# Patient Record
Sex: Female | Born: 1950 | Race: White | Hispanic: No | Marital: Single | Smoking: Never smoker
Health system: Southern US, Community
[De-identification: ages and names within clinical notes are randomized; demographics above are authoritative.]

## PROBLEM LIST (undated history)

## (undated) DIAGNOSIS — H269 Unspecified cataract: Secondary | ICD-10-CM

## (undated) DIAGNOSIS — F419 Anxiety disorder, unspecified: Secondary | ICD-10-CM

## (undated) HISTORY — DX: Unspecified cataract: H26.9

## (undated) HISTORY — DX: Anxiety disorder, unspecified: F41.9

---

## 1997-12-12 ENCOUNTER — Other Ambulatory Visit: Admission: RE | Admit: 1997-12-12 | Discharge: 1997-12-12 | Payer: Self-pay | Admitting: Obstetrics and Gynecology

## 1999-03-16 ENCOUNTER — Encounter (INDEPENDENT_AMBULATORY_CARE_PROVIDER_SITE_OTHER): Payer: Self-pay | Admitting: Specialist

## 1999-03-16 ENCOUNTER — Ambulatory Visit (HOSPITAL_COMMUNITY): Admission: RE | Admit: 1999-03-16 | Discharge: 1999-03-16 | Payer: Self-pay | Admitting: Gastroenterology

## 1999-05-04 ENCOUNTER — Encounter: Admission: RE | Admit: 1999-05-04 | Discharge: 1999-05-04 | Payer: Self-pay | Admitting: Gastroenterology

## 1999-05-04 ENCOUNTER — Encounter: Payer: Self-pay | Admitting: Gastroenterology

## 1999-09-26 ENCOUNTER — Encounter: Payer: Self-pay | Admitting: Gastroenterology

## 1999-09-26 ENCOUNTER — Encounter: Admission: RE | Admit: 1999-09-26 | Discharge: 1999-09-26 | Payer: Self-pay | Admitting: Gastroenterology

## 1999-10-16 ENCOUNTER — Other Ambulatory Visit: Admission: RE | Admit: 1999-10-16 | Discharge: 1999-10-16 | Payer: Self-pay | Admitting: *Deleted

## 2002-06-24 ENCOUNTER — Other Ambulatory Visit: Admission: RE | Admit: 2002-06-24 | Discharge: 2002-06-24 | Payer: Self-pay | Admitting: Obstetrics and Gynecology

## 2002-06-30 ENCOUNTER — Encounter: Payer: Self-pay | Admitting: Obstetrics and Gynecology

## 2002-06-30 ENCOUNTER — Encounter: Admission: RE | Admit: 2002-06-30 | Discharge: 2002-06-30 | Payer: Self-pay | Admitting: Obstetrics and Gynecology

## 2003-08-05 ENCOUNTER — Other Ambulatory Visit: Admission: RE | Admit: 2003-08-05 | Discharge: 2003-08-05 | Payer: Self-pay | Admitting: Obstetrics and Gynecology

## 2003-08-18 ENCOUNTER — Encounter: Admission: RE | Admit: 2003-08-18 | Discharge: 2003-08-18 | Payer: Self-pay | Admitting: Family Medicine

## 2004-10-19 ENCOUNTER — Encounter: Admission: RE | Admit: 2004-10-19 | Discharge: 2004-10-19 | Payer: Self-pay | Admitting: Obstetrics and Gynecology

## 2005-02-06 ENCOUNTER — Encounter: Admission: RE | Admit: 2005-02-06 | Discharge: 2005-02-06 | Payer: Self-pay | Admitting: Obstetrics and Gynecology

## 2005-03-19 ENCOUNTER — Other Ambulatory Visit: Admission: RE | Admit: 2005-03-19 | Discharge: 2005-03-19 | Payer: Self-pay | Admitting: Obstetrics and Gynecology

## 2005-09-10 ENCOUNTER — Encounter: Admission: RE | Admit: 2005-09-10 | Discharge: 2005-09-10 | Payer: Self-pay | Admitting: Family Medicine

## 2007-06-04 ENCOUNTER — Encounter: Admission: RE | Admit: 2007-06-04 | Discharge: 2007-06-04 | Payer: Self-pay | Admitting: Obstetrics and Gynecology

## 2010-06-10 ENCOUNTER — Encounter: Payer: Self-pay | Admitting: Family Medicine

## 2010-10-12 ENCOUNTER — Other Ambulatory Visit: Payer: Self-pay | Admitting: Family Medicine

## 2010-10-12 DIAGNOSIS — Z1231 Encounter for screening mammogram for malignant neoplasm of breast: Secondary | ICD-10-CM

## 2010-10-19 ENCOUNTER — Ambulatory Visit
Admission: RE | Admit: 2010-10-19 | Discharge: 2010-10-19 | Disposition: A | Discharge status: Home / Self Care | Payer: Self-pay | Source: Ambulatory Visit | Attending: Family Medicine | Admitting: Family Medicine

## 2010-10-19 DIAGNOSIS — Z1231 Encounter for screening mammogram for malignant neoplasm of breast: Secondary | ICD-10-CM

## 2011-06-23 ENCOUNTER — Telehealth: Payer: Self-pay | Admitting: Physician Assistant

## 2011-06-23 MED ORDER — LORAZEPAM 1 MG PO TABS: 1.0000 mg | ORAL_TABLET | Freq: Every day | ORAL | Status: AC

## 2011-06-23 NOTE — Telephone Encounter (Signed)
Please call to pharmacy.

## 2011-06-24 NOTE — Telephone Encounter (Signed)
rx denied and called to pharmacy

## 2011-07-26 ENCOUNTER — Other Ambulatory Visit: Payer: Self-pay | Admitting: *Deleted

## 2011-08-22 ENCOUNTER — Ambulatory Visit (INDEPENDENT_AMBULATORY_CARE_PROVIDER_SITE_OTHER): Payer: BC Managed Care – PPO | Admitting: Family Medicine

## 2011-08-22 ENCOUNTER — Encounter: Payer: Self-pay | Admitting: Family Medicine

## 2011-08-22 VITALS — BP 82/53 | HR 72 | Temp 97.9°F | Resp 16 | Ht 65.5 in | Wt 114.6 lb

## 2011-08-22 DIAGNOSIS — F419 Anxiety disorder, unspecified: Secondary | ICD-10-CM

## 2011-08-22 DIAGNOSIS — F411 Generalized anxiety disorder: Secondary | ICD-10-CM

## 2011-08-22 MED ORDER — CITALOPRAM HYDROBROMIDE 20 MG PO TABS: 20.0000 mg | ORAL_TABLET | Freq: Every day | ORAL | Status: DC

## 2011-08-22 MED ORDER — LORAZEPAM 1 MG PO TABS: 1.0000 mg | ORAL_TABLET | Freq: Every evening | ORAL | Status: DC | PRN

## 2011-08-22 NOTE — Progress Notes (Signed)
Recently saw [facility] Doing well on meds Will see Dr. Evelene Croon next month.  Objective: thin, attractive woman in no distress. We chatted for awhile. Patient wants to eventually get off meds, but when she tries to d/c, her symptoms come back. Hence, the appointment with Dr. Evelene Croon. HEENT unremarkable  Chest: Clear  Heart: Regular no murmur or gallop  Abdomen: Soft nontender without HSM  A: no change, patient doing well on nontoxic meds OK to refill meds x 1 year.

## 2012-01-26 ENCOUNTER — Other Ambulatory Visit: Payer: Self-pay | Admitting: Family Medicine

## 2012-03-31 ENCOUNTER — Telehealth: Payer: Self-pay | Admitting: Radiology

## 2012-03-31 NOTE — Telephone Encounter (Signed)
Phone call to patient, she needs bone density study per her insurance company. Have left message for her so she can discuss with Dr Milus Glazier at next visit.

## 2012-05-15 ENCOUNTER — Telehealth: Payer: Self-pay | Admitting: *Deleted

## 2012-05-15 DIAGNOSIS — F419 Anxiety disorder, unspecified: Secondary | ICD-10-CM

## 2012-05-15 MED ORDER — LORAZEPAM 1 MG PO TABS: 1.0000 mg | ORAL_TABLET | Freq: Every evening | ORAL | Status: DC | PRN

## 2012-05-15 NOTE — Telephone Encounter (Signed)
Pharmacy requesting refill on lorazepam 1mg . Last refill on 02/09/12

## 2012-05-15 NOTE — Telephone Encounter (Signed)
Rx printed.  Please advise patient that she will need an OV with Dr. Milus Glazier for additional refills.

## 2012-07-29 ENCOUNTER — Telehealth: Payer: Self-pay

## 2012-07-29 DIAGNOSIS — F419 Anxiety disorder, unspecified: Secondary | ICD-10-CM

## 2012-07-29 NOTE — Telephone Encounter (Signed)
Please advise 

## 2012-07-29 NOTE — Telephone Encounter (Signed)
Please refill the lorazepam for 3 months

## 2012-07-29 NOTE — Telephone Encounter (Signed)
[facility] requests refill on pt's Lorazepam 1 mg

## 2012-07-30 MED ORDER — LORAZEPAM 1 MG PO TABS: 1.0000 mg | ORAL_TABLET | Freq: Every evening | ORAL | Status: DC | PRN

## 2012-07-30 NOTE — Telephone Encounter (Signed)
Printed so we can fax to [facility]. After Dr L signs.

## 2012-07-30 NOTE — Telephone Encounter (Signed)
See Dr. Loma Boston note below

## 2012-08-29 ENCOUNTER — Other Ambulatory Visit: Payer: Self-pay | Admitting: Family Medicine

## 2012-09-21 ENCOUNTER — Other Ambulatory Visit: Payer: Self-pay | Admitting: Radiology

## 2012-09-21 DIAGNOSIS — F419 Anxiety disorder, unspecified: Secondary | ICD-10-CM

## 2012-09-21 NOTE — Telephone Encounter (Signed)
declined Lorazepam Rx have gotten the fax from [facility]. Per last Rx, patient needed appt for refills. Sent denial

## 2012-09-30 ENCOUNTER — Telehealth: Payer: Self-pay

## 2012-09-30 NOTE — Telephone Encounter (Signed)
Pharm requests RF of lorazepam 10 mg.

## 2012-10-01 ENCOUNTER — Other Ambulatory Visit: Payer: Self-pay | Admitting: Family Medicine

## 2012-10-01 ENCOUNTER — Telehealth: Payer: Self-pay | Admitting: *Deleted

## 2012-10-01 DIAGNOSIS — F419 Anxiety disorder, unspecified: Secondary | ICD-10-CM

## 2012-10-01 MED ORDER — LORAZEPAM 1 MG PO TABS: ORAL_TABLET | ORAL | Status: DC

## 2012-10-01 NOTE — Telephone Encounter (Signed)
Faxed prescription to [facility] [phone] for lorazepam 1 mg per Dr Milus Glazier, and message left in patient's voice-mail at home number.

## 2012-10-02 NOTE — Telephone Encounter (Signed)
Dr L sent this in.

## 2012-11-06 ENCOUNTER — Telehealth: Payer: Self-pay

## 2012-11-06 MED ORDER — CITALOPRAM HYDROBROMIDE 20 MG PO TABS: 20.0000 mg | ORAL_TABLET | Freq: Every day | ORAL | Status: DC

## 2012-11-06 NOTE — Telephone Encounter (Signed)
Sent.  Needs OV for more

## 2012-11-06 NOTE — Telephone Encounter (Signed)
Pharmacy requests refill on Citalopram 20 mg

## 2012-11-08 ENCOUNTER — Ambulatory Visit (INDEPENDENT_AMBULATORY_CARE_PROVIDER_SITE_OTHER): Payer: BC Managed Care – PPO | Admitting: Family Medicine

## 2012-11-08 VITALS — BP 88/60 | HR 70 | Temp 97.6°F | Resp 16 | Ht 65.5 in | Wt 113.4 lb

## 2012-11-08 DIAGNOSIS — F419 Anxiety disorder, unspecified: Secondary | ICD-10-CM

## 2012-11-08 DIAGNOSIS — R5381 Other malaise: Secondary | ICD-10-CM

## 2012-11-08 DIAGNOSIS — M899 Disorder of bone, unspecified: Secondary | ICD-10-CM

## 2012-11-08 DIAGNOSIS — M21619 Bunion of unspecified foot: Secondary | ICD-10-CM

## 2012-11-08 DIAGNOSIS — M21611 Bunion of right foot: Secondary | ICD-10-CM

## 2012-11-08 DIAGNOSIS — M8580 Other specified disorders of bone density and structure, unspecified site: Secondary | ICD-10-CM

## 2012-11-08 DIAGNOSIS — F411 Generalized anxiety disorder: Secondary | ICD-10-CM

## 2012-11-08 LAB — POCT GLYCOSYLATED HEMOGLOBIN (HGB A1C): Hemoglobin A1C: 5.2

## 2012-11-08 LAB — POCT CBC
Granulocyte percent: 57.2 %G (ref 37–80)
HCT, POC: 38.3 % (ref 37.7–47.9)
Hemoglobin: 11.9 g/dL — AB (ref 12.2–16.2)
Lymph, poc: 2.5 (ref 0.6–3.4)
MCH, POC: 30.1 pg (ref 27–31.2)
MCHC: 31.1 g/dL — AB (ref 31.8–35.4)
MCV: 96.7 fL (ref 80–97)
MID (cbc): 0.5 (ref 0–0.9)
MPV: 8.7 fL (ref 0–99.8)
POC Granulocyte: 3.9 (ref 2–6.9)
POC LYMPH PERCENT: 36.2 %L (ref 10–50)
POC MID %: 6.6 %M (ref 0–12)
Platelet Count, POC: 169 10*3/uL (ref 142–424)
RBC: 3.96 M/uL — AB (ref 4.04–5.48)
RDW, POC: 13.6 %
WBC: 6.9 10*3/uL (ref 4.6–10.2)

## 2012-11-08 LAB — POCT SEDIMENTATION RATE: POCT SED RATE: 14 mm/hr (ref 0–22)

## 2012-11-08 MED ORDER — CITALOPRAM HYDROBROMIDE 20 MG PO TABS: 20.0000 mg | ORAL_TABLET | Freq: Every day | ORAL | Status: DC

## 2012-11-08 MED ORDER — LORAZEPAM 1 MG PO TABS: ORAL_TABLET | ORAL | Status: DC

## 2012-11-08 NOTE — Progress Notes (Signed)
  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  Denies feelings of depression. 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 No chest pain, shortness of breath.  Objective: NAD HEENT: Unremarkable Neck: Supple no adenopathy Chest: Clear to auscultation Heart: Regular no murmur Abdomen: Soft nontender, scaphoid, no HSM or masses  Assessment: Extreme fatigue of unknown etiology, insomnia which is not responding is hoped to the lorazepam. Patient is certain that she doesn't have depression although her symptoms certainly sound like a depressive syndrome. Need to rule out organic cause of this fatigue and trouble concentrating.  Anxiety - Plan: citalopram (CELEXA) 20 MG tablet, LORazepam (ATIVAN) 1 MG tablet  Other malaise and fatigue - Plan: POCT CBC, POCT glycosylated hemoglobin (Hb A1C), POCT SEDIMENTATION RATE, Comprehensive metabolic panel, Lipid panel, Vitamin D, 25-hydroxy, Vitamin B12  Osteopenia - Plan: DG Bone Density  Bunion, right foot - Plan: Ambulatory referral to Podiatry  Signed, Elvina Sidle, MD

## 2012-11-09 LAB — COMPREHENSIVE METABOLIC PANEL
ALT: 15 U/L (ref 0–35)
AST: 24 U/L (ref 0–37)
Albumin: 3.9 g/dL (ref 3.5–5.2)
Alkaline Phosphatase: 55 U/L (ref 39–117)
BUN: 19 mg/dL (ref 6–23)
CO2: 25 mEq/L (ref 19–32)
Calcium: 9.2 mg/dL (ref 8.4–10.5)
Chloride: 103 mEq/L (ref 96–112)
Creat: 0.87 mg/dL (ref 0.50–1.10)
Glucose, Bld: 77 mg/dL (ref 70–99)
Potassium: 3.8 mEq/L (ref 3.5–5.3)
Sodium: 135 mEq/L (ref 135–145)
Total Bilirubin: 0.4 mg/dL (ref 0.3–1.2)
Total Protein: 6.1 g/dL (ref 6.0–8.3)

## 2012-11-09 LAB — LIPID PANEL
Cholesterol: 174 mg/dL (ref 0–200)
HDL: 64 mg/dL (ref >39)
LDL Cholesterol: 99 mg/dL (ref 0–99)
Total CHOL/HDL Ratio: 2.7 Ratio
Triglycerides: 57 mg/dL (ref <150)
VLDL: 11 mg/dL (ref 0–40)

## 2012-11-09 LAB — VITAMIN B12: Vitamin B-12: 1526 pg/mL — ABNORMAL HIGH (ref 211–911)

## 2012-11-10 LAB — VITAMIN D 25 HYDROXY (VIT D DEFICIENCY, FRACTURES): Vit D, 25-Hydroxy: 65 ng/mL (ref 30–89)

## 2012-11-11 ENCOUNTER — Other Ambulatory Visit: Payer: Self-pay

## 2012-11-11 DIAGNOSIS — Z1231 Encounter for screening mammogram for malignant neoplasm of breast: Secondary | ICD-10-CM

## 2012-11-11 DIAGNOSIS — Z803 Family history of malignant neoplasm of breast: Secondary | ICD-10-CM

## 2012-12-09 ENCOUNTER — Ambulatory Visit
Admission: RE | Admit: 2012-12-09 | Discharge: 2012-12-09 | Disposition: A | Discharge status: Home / Self Care | Payer: BC Managed Care – PPO | Source: Ambulatory Visit

## 2012-12-09 ENCOUNTER — Other Ambulatory Visit: Payer: Self-pay | Admitting: Physician Assistant

## 2012-12-09 ENCOUNTER — Ambulatory Visit
Admission: RE | Admit: 2012-12-09 | Discharge: 2012-12-09 | Disposition: A | Discharge status: Home / Self Care | Payer: BC Managed Care – PPO | Source: Ambulatory Visit | Attending: Family Medicine | Admitting: Family Medicine

## 2012-12-09 DIAGNOSIS — Z803 Family history of malignant neoplasm of breast: Secondary | ICD-10-CM

## 2012-12-09 DIAGNOSIS — Z1231 Encounter for screening mammogram for malignant neoplasm of breast: Secondary | ICD-10-CM

## 2012-12-09 DIAGNOSIS — M8580 Other specified disorders of bone density and structure, unspecified site: Secondary | ICD-10-CM

## 2012-12-10 ENCOUNTER — Other Ambulatory Visit: Payer: Self-pay | Admitting: Physician Assistant

## 2012-12-16 ENCOUNTER — Encounter: Payer: Self-pay | Admitting: Radiology

## 2012-12-21 ENCOUNTER — Telehealth: Payer: Self-pay

## 2012-12-21 NOTE — Telephone Encounter (Signed)
Yes, she needs appt. She can take Vitamin D and calcium but there may be other treatments needed for her bone loss. Left detailed message.

## 2012-12-21 NOTE — Telephone Encounter (Signed)
Pt had gotten two calls and a letter from [place] about bone loss density.  Said she was told to schedule an appointment and that usually a person with this needs to take Vitamin D.  She wants to know if she only needs to take Vitamin D, then how much of it and does this really require an appointment if that is all she needs to do?  Please call [phone]

## 2012-12-23 DIAGNOSIS — H5203 Hypermetropia, bilateral: Secondary | ICD-10-CM | POA: Insufficient documentation

## 2012-12-23 DIAGNOSIS — Z961 Presence of intraocular lens: Secondary | ICD-10-CM | POA: Insufficient documentation

## 2013-06-10 ENCOUNTER — Ambulatory Visit: Payer: BC Managed Care – PPO | Admitting: Family Medicine

## 2013-06-15 ENCOUNTER — Encounter: Payer: Self-pay | Admitting: Family Medicine

## 2013-06-15 ENCOUNTER — Ambulatory Visit (INDEPENDENT_AMBULATORY_CARE_PROVIDER_SITE_OTHER): Payer: BC Managed Care – PPO | Admitting: Family Medicine

## 2013-06-15 VITALS — BP 104/64 | HR 55 | Temp 98.0°F | Resp 16 | Ht 66.0 in | Wt 117.6 lb

## 2013-06-15 DIAGNOSIS — F3289 Other specified depressive episodes: Secondary | ICD-10-CM

## 2013-06-15 DIAGNOSIS — M810 Age-related osteoporosis without current pathological fracture: Secondary | ICD-10-CM

## 2013-06-15 DIAGNOSIS — F32A Depression, unspecified: Secondary | ICD-10-CM

## 2013-06-15 DIAGNOSIS — F329 Major depressive disorder, single episode, unspecified: Secondary | ICD-10-CM

## 2013-06-15 MED ORDER — DULOXETINE HCL 30 MG PO CPEP: ORAL_CAPSULE | ORAL | Status: DC

## 2013-06-15 NOTE — Patient Instructions (Signed)
Stop the Citalopram Start the Cymbalta 30 mg once daily for 14 days, then bid. We're checking the parathyroid and thyroid You may resume running.

## 2013-06-15 NOTE — Progress Notes (Signed)
This chart was scribed for Amber Sidle, MD by Quintella Reichert, ED scribe.  This patient was seen in [facility] Room 25 and the patient's care was started at 2:45 PM.   Patient ID: Amber Peck MRN: [id number], DOB: Nov 19, 1950, 63 y.o. 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  Pt has had known osteopenia for some time and received a DG bone density on 11/08/12 which revealed osteoporosis (see below).  She is not taking any medications for this currently because she never had a f/u appointment to discuss her treatment.  She is no longer running due to her osteoporosis because she is concerned over risk of  fracture.  She tries to eat a healthy diet including a large amount of dairy and meat, and she states she has done so throughout her life.  Pt is also requesting labs for her thyroids and "brain chemistry."  She has been on the same dosage of antidepressants (Celexa 20mg ) for some time but states she has been having episodes of breakthrough severe depression in which she sobs uncontrollably.  She denies any specific triggers that may explain these episodes.  Pt has also been seen in the past for the following symptoms which began approximately one year ago: -Extreme fatigue  -Insomnia   -Trouble concentrating  She was last seen for these on 11/08/12 and received multiple labs (POCT CBC, POCT glycosylated hemoglobin (Hb A1C), POCT SEDIMENTATION RATE, Comprehensive metabolic panel, Lipid panel, Vitamin D, 25-hydroxy, and Vitamin B12) which were all normal.     Past Medical History  Diagnosis Date   Cataract    Anxiety      Home Meds: Prior to Admission medications   Medication Sig Start Date End Date Taking? Authorizing Provider  acyclovir (ZOVIRAX) 400 MG tablet TAKE ONE TABLET BY MOUTH TWICE DAILY AS NEEDED 01/26/12   Nelva Nay, PA-C  citalopram (CELEXA) 20 MG tablet Take 1 tablet (20 mg total) by mouth daily. 11/08/12   Amber Sidle, MD  citalopram (CELEXA) 20 MG tablet TAKE ONE TABLET BY MOUTH ONE TIME DAILY 12/09/12   Amber Sidle, MD  LORazepam (ATIVAN) 1 MG tablet One prn hs 11/08/12   Amber Sidle, MD    Allergies: No Known Allergies  History   Social History   Marital Status: Single    Spouse Name: N/A    Number of Children: N/A   Years of Education: N/A   Occupational History   Not on file.   Social History Main Topics   Smoking status: Never Smoker    Smokeless tobacco: Not on file   Alcohol Use: Yes     Comment: 2-3 glasses of wine or 2-3 beers per week   Drug Use: No   Sexual Activity: Not Currently   Other Topics Concern   Not on file   Social History Narrative   No narrative on file     Review of Systems: Constitutional: negative for chills, fever, night sweats, weight changes, or fatigue  HEENT: negative for vision changes, hearing loss, congestion, rhinorrhea, ST, epistaxis, or sinus pressure Cardiovascular: negative for chest pain or palpitations Respiratory: negative for hemoptysis, wheezing, shortness of breath, or cough Abdominal: negative for abdominal pain, nausea, vomiting, diarrhea, or constipation Dermatological: negative for rash Neurologic: negative for headache, dizziness, or syncope Psychiatric: positive for dysphoric mood All other systems reviewed and are otherwise negative  with the exception to those above and in the HPI.   Physical Exam: Blood pressure 104/64, pulse 55, temperature 98 F (36.7 C), temperature source Oral, resp. rate 16, height 5\' 6"  (1.676 m), weight 117 lb 9.6 oz (53.343 kg), SpO2 100.00%., Body mass index is 18.99 kg/(m^2). General: Well developed, well nourished, in no acute distress. Head: Normocephalic, atraumatic, eyes without discharge, sclera non-icteric, nares are without discharge. Bilateral auditory canals  clear, TM's are without perforation, pearly grey and translucent with reflective cone of light bilaterally. Oral cavity moist, posterior pharynx without exudate, erythema, peritonsillar abscess, or post nasal drip.  Neck: Supple. No thyromegaly. Full ROM. No lymphadenopathy. Lungs: Clear bilaterally to auscultation without wheezes, rales, or rhonchi. Breathing is unlabored. Heart: RRR with S1 S2. No murmurs, rubs, or gallops appreciated. Abdomen: Soft, non-tender, non-distended with normoactive bowel sounds. No hepatomegaly. No rebound/guarding. No obvious abdominal masses. Msk:  Strength and tone normal for age. Extremities/Skin: Warm and dry. No clubbing or cyanosis. No edema. No rashes or suspicious lesions. Neuro: Alert and oriented X 3. Moves all extremities spontaneously. Gait is normal. CNII-XII grossly in tact. Psych:  Responds to questions appropriately with a normal affect.   Labs:   *RADIOLOGY REPORT*  Clinical Data: 63 year old postmenopausal female with history of  low bone mass.   DUAL X-RAY ABSORPTIOMETRY (DXA) FOR BONE MINERAL DENSITY  AP LUMBAR SPINE (L1 - L4)  Bone Mineral Density (BMD): 0.918 g/cm2  Young Adult T Score: -1.2  Z Score: 0.3  LEFT FEMUR (NECK)  Bone Mineral Density (BMD): 0.531 g/cm2  Young Adult T Score: -2.9  Z Score: -1.5   ASSESSMENT: Patient's diagnostic category is OSTEOPOROSIS by WHO  Criteria.   FRACTURE RISK: INCREASED  FRAX: World Health Organization FRAX assessment of absolute  fracture risk is not calculated for this patient because the  patient has osteoporosis.  Comparison: There has been a statistically significant 11.9%  decrease in BMD in the spine and a statistically significant 5%  decrease in BMD in the total left hip as compared to 09/26/1999.  There has been a statistically significant 9.5% decrease in BMD in  the spine and a statistically significant 13.8% decrease in BMD in  the total left hip as compared to 06/30/2002.    RECOMMENDATIONS:  All patients should ensure an adequate intake of dietary calcium  (1200mg  daily) and vitamin D (800 IU daily) unless contraindicated.  The National Osteoporosis Foundation recommends that FDA-approved  medical therapies be considered in postmenopausal women and mean  age 62 or older with a:  1) Hip or vertebral (clinical or morphometric) fracture.  2) T-score of -2.5 or lower at the spine or hip.  3) Ten-year fracture probability by FRAX of 3% or greater for hip  fracture or 20% or greater for major osteoporotic fracture.   FOLLOW-UP:  People with diagnosed cases of osteoporosis or at high risk for  fracture should have regular bone mineral density tests. For  patients eligible for Medicare, routine testing is allowed once  every 2 years. The testing frequency can be increased to one year  for patients who have rapidly progressing disease, those who are  receiving or discontinuing medical therapy to restore bone mass, or  have additional risk factors.  Original Report Authenticated By: Cain Saupe, M.D.   ASSESSMENT AND PLAN:  63 y.o. year old female with osteoporis and symptoms of depression, ongoing and bothersome May resume running Citrical plus D bid Change from citalopram to Cymbalta 30 qd x 14,  then twice daily Check parathyroid and thyroid levels.   Signed, Amber SidleKurt Lauenstein, MD 06/15/2013 3:05 PM

## 2013-06-16 ENCOUNTER — Telehealth: Payer: Self-pay

## 2013-06-16 LAB — PTH, INTACT AND CALCIUM
Calcium: 9.3 mg/dL (ref 8.4–10.5)
PTH: 55 pg/mL (ref 14.0–72.0)

## 2013-06-16 LAB — TSH: TSH: 1.678 u[IU]/mL (ref 0.350–4.500)

## 2013-06-16 NOTE — Telephone Encounter (Signed)
Pt is returning call for lab results  Best number [phone]

## 2013-06-17 NOTE — Telephone Encounter (Signed)
See labs. Pt notified.  

## 2013-07-30 ENCOUNTER — Other Ambulatory Visit: Payer: Self-pay | Admitting: Family Medicine

## 2013-08-02 NOTE — Telephone Encounter (Signed)
Called in.

## 2013-08-14 ENCOUNTER — Other Ambulatory Visit: Payer: Self-pay | Admitting: Family Medicine

## 2013-08-14 ENCOUNTER — Encounter: Payer: Self-pay | Admitting: Family Medicine

## 2013-08-14 DIAGNOSIS — R5383 Other fatigue: Secondary | ICD-10-CM

## 2013-08-14 DIAGNOSIS — M810 Age-related osteoporosis without current pathological fracture: Secondary | ICD-10-CM

## 2013-09-02 ENCOUNTER — Encounter: Payer: Self-pay | Admitting: *Deleted

## 2013-10-28 DIAGNOSIS — Z0271 Encounter for disability determination: Secondary | ICD-10-CM

## 2013-11-30 ENCOUNTER — Other Ambulatory Visit: Payer: Self-pay | Admitting: Family Medicine

## 2013-12-17 ENCOUNTER — Telehealth: Payer: Self-pay

## 2013-12-17 NOTE — Telephone Encounter (Signed)
Patient states she was unable to pick up her anti-depressant at the pharmacy. Please return call and advise as the pharmacy told patient Dr. Elbert EwingsL would need to approve. Patient is going out of town tomorrow. CB # [phone]

## 2013-12-17 NOTE — Telephone Encounter (Signed)
Left message to return call 

## 2013-12-18 NOTE — Telephone Encounter (Signed)
Pt picked up med from pharmacy 12/17/13

## 2014-01-08 ENCOUNTER — Ambulatory Visit (INDEPENDENT_AMBULATORY_CARE_PROVIDER_SITE_OTHER): Payer: BC Managed Care – PPO | Admitting: Family Medicine

## 2014-01-08 VITALS — BP 100/68 | HR 77 | Temp 98.7°F | Resp 16 | Ht 65.75 in | Wt 115.6 lb

## 2014-01-08 DIAGNOSIS — G4700 Insomnia, unspecified: Secondary | ICD-10-CM

## 2014-01-08 DIAGNOSIS — Z91030 Bee allergy status: Secondary | ICD-10-CM

## 2014-01-08 DIAGNOSIS — Z8619 Personal history of other infectious and parasitic diseases: Secondary | ICD-10-CM

## 2014-01-08 DIAGNOSIS — Z91038 Other insect allergy status: Secondary | ICD-10-CM

## 2014-01-08 DIAGNOSIS — F39 Unspecified mood [affective] disorder: Secondary | ICD-10-CM

## 2014-01-08 LAB — POCT CBC
Granulocyte percent: 61.3 %G (ref 37–80)
HCT, POC: 40.4 % (ref 37.7–47.9)
Hemoglobin: 13.1 g/dL (ref 12.2–16.2)
Lymph, poc: 2 (ref 0.6–3.4)
MCH, POC: 30.5 pg (ref 27–31.2)
MCHC: 32.4 g/dL (ref 31.8–35.4)
MCV: 94.1 fL (ref 80–97)
MID (cbc): 0.4 (ref 0–0.9)
MPV: 7.4 fL (ref 0–99.8)
POC Granulocyte: 3.8 (ref 2–6.9)
POC LYMPH PERCENT: 31.5 %L (ref 10–50)
POC MID %: 7.2 %M (ref 0–12)
Platelet Count, POC: 193 10*3/uL (ref 142–424)
RBC: 4.29 M/uL (ref 4.04–5.48)
RDW, POC: 14.3 %
WBC: 6.2 10*3/uL (ref 4.6–10.2)

## 2014-01-08 LAB — COMPREHENSIVE METABOLIC PANEL
ALT: 14 U/L (ref 0–35)
AST: 20 U/L (ref 0–37)
Albumin: 4 g/dL (ref 3.5–5.2)
Alkaline Phosphatase: 58 U/L (ref 39–117)
BUN: 17 mg/dL (ref 6–23)
CO2: 30 mEq/L (ref 19–32)
Calcium: 8.6 mg/dL (ref 8.4–10.5)
Chloride: 103 mEq/L (ref 96–112)
Creat: 0.67 mg/dL (ref 0.50–1.10)
Glucose, Bld: 96 mg/dL (ref 70–99)
Potassium: 4.8 mEq/L (ref 3.5–5.3)
Sodium: 138 mEq/L (ref 135–145)
Total Bilirubin: 0.3 mg/dL (ref 0.2–1.2)
Total Protein: 6 g/dL (ref 6.0–8.3)

## 2014-01-08 LAB — TSH: TSH: 1.433 u[IU]/mL (ref 0.350–4.500)

## 2014-01-08 LAB — VITAMIN B12: Vitamin B-12: 1019 pg/mL — ABNORMAL HIGH (ref 211–911)

## 2014-01-08 MED ORDER — ACYCLOVIR 400 MG PO TABS: 400.0000 mg | ORAL_TABLET | Freq: Two times a day (BID) | ORAL | Status: DC

## 2014-01-08 MED ORDER — DULOXETINE HCL 30 MG PO CPEP: 60.0000 mg | ORAL_CAPSULE | Freq: Two times a day (BID) | ORAL | Status: DC

## 2014-01-08 MED ORDER — EPINEPHRINE 0.3 MG/0.3ML IJ SOAJ: 0.3000 mg | Freq: Once | INTRAMUSCULAR | Status: DC

## 2014-01-08 MED ORDER — LORAZEPAM 1 MG PO TABS: 1.0000 mg | ORAL_TABLET | Freq: Every day | ORAL | Status: DC

## 2014-01-08 MED ORDER — PREDNISONE 20 MG PO TABS: 40.0000 mg | ORAL_TABLET | Freq: Every day | ORAL | Status: DC

## 2014-01-08 NOTE — Progress Notes (Signed)
Is a 63 year old woman comes in for medicine refills. She's been doing well this summer, walking daily and staying TRAM. She did have a cough back in May and has had some hoarseness intermittently since that seems to be gradually resolving.  Patient would like to have her vitamin D checked. Her colonoscopy, and other preventive health issues are up-to-date.  Patient is doing well on her current medications are she thought about weaning herself off at this time she's doing well enough that she would like to renew everything.  Patient recently had a bee sting which was followed by a secondary bee sting a few days later. He developed marked swelling and redness and [facility]one at that time  Objective: No acute distress HEENT: Unremarkable Neck: Supple no adenopathy or thyromegaly Chest: Clear Heart: Regular no murmur Abdomen: Soft nontender without HSM or guarding or rebound Skin: Normal Extremities: No edema, good pedal pulses  Allergy to bee sting - Plan: EPINEPHrine (EPIPEN) 0.3 mg/0.3 mL IJ SOAJ injection, predniSONE (DELTASONE) 20 MG tablet  Insomnia - Plan: LORazepam (ATIVAN) 1 MG tablet  Affective disorder - Plan: DULoxetine (CYMBALTA) 30 MG capsule, Vit D  25 hydroxy (rtn osteoporosis monitoring), Vitamin B12, Comprehensive metabolic panel, TSH, POCT CBC  H/O cold sores - Plan: acyclovir (ZOVIRAX) 400 MG tablet  Signed, Elvina SidleKurt Sherrye Puga, MD

## 2014-01-10 LAB — VITAMIN D 25 HYDROXY (VIT D DEFICIENCY, FRACTURES): Vit D, 25-Hydroxy: 108 ng/mL — ABNORMAL HIGH (ref 30–89)

## 2014-09-28 ENCOUNTER — Encounter (HOSPITAL_BASED_OUTPATIENT_CLINIC_OR_DEPARTMENT_OTHER): Payer: Self-pay | Admitting: *Deleted

## 2014-09-28 ENCOUNTER — Emergency Department (HOSPITAL_COMMUNITY): Payer: BC Managed Care – PPO

## 2014-09-28 ENCOUNTER — Other Ambulatory Visit: Payer: Self-pay

## 2014-09-28 ENCOUNTER — Emergency Department (HOSPITAL_BASED_OUTPATIENT_CLINIC_OR_DEPARTMENT_OTHER): Payer: BC Managed Care – PPO

## 2014-09-28 ENCOUNTER — Emergency Department (HOSPITAL_BASED_OUTPATIENT_CLINIC_OR_DEPARTMENT_OTHER)
Admission: EM | Admit: 2014-09-28 | Discharge: 2014-09-28 | Disposition: A | Discharge status: Home / Self Care | Payer: BC Managed Care – PPO | Attending: Emergency Medicine | Admitting: Emergency Medicine

## 2014-09-28 DIAGNOSIS — R42 Dizziness and giddiness: Secondary | ICD-10-CM | POA: Diagnosis not present

## 2014-09-28 DIAGNOSIS — R531 Weakness: Secondary | ICD-10-CM | POA: Insufficient documentation

## 2014-09-28 DIAGNOSIS — R202 Paresthesia of skin: Secondary | ICD-10-CM | POA: Diagnosis not present

## 2014-09-28 DIAGNOSIS — R61 Generalized hyperhidrosis: Secondary | ICD-10-CM

## 2014-09-28 LAB — CBC WITH DIFFERENTIAL/PLATELET
Basophils Absolute: 0 10*3/uL (ref 0.0–0.1)
Basophils Relative: 1 % (ref 0–1)
EOS ABS: 0.1 10*3/uL (ref 0.0–0.7)
Eosinophils Relative: 2 % (ref 0–5)
HCT: 40.3 % (ref 36.0–46.0)
Hemoglobin: 13.5 g/dL (ref 12.0–15.0)
Lymphocytes Relative: 44 % (ref 12–46)
Lymphs Abs: 2.2 10*3/uL (ref 0.7–4.0)
MCH: 30.5 pg (ref 26.0–34.0)
MCHC: 33.5 g/dL (ref 30.0–36.0)
MCV: 91.2 fL (ref 78.0–100.0)
Monocytes Absolute: 0.4 10*3/uL (ref 0.1–1.0)
Monocytes Relative: 8 % (ref 3–12)
NEUTROS PCT: 45 % (ref 43–77)
Neutro Abs: 2.2 10*3/uL (ref 1.7–7.7)
Platelets: 177 10*3/uL (ref 150–400)
RBC: 4.42 MIL/uL (ref 3.87–5.11)
RDW: 12.6 % (ref 11.5–15.5)
WBC: 4.8 10*3/uL (ref 4.0–10.5)

## 2014-09-28 LAB — URINALYSIS, ROUTINE W REFLEX MICROSCOPIC
BILIRUBIN URINE: NEGATIVE
Glucose, UA: NEGATIVE mg/dL
HGB URINE DIPSTICK: NEGATIVE
KETONES UR: NEGATIVE mg/dL
Leukocytes, UA: NEGATIVE
NITRITE: NEGATIVE
Protein, ur: NEGATIVE mg/dL
Specific Gravity, Urine: 1.011 (ref 1.005–1.030)
UROBILINOGEN UA: 0.2 mg/dL (ref 0.0–1.0)
pH: 7.5 (ref 5.0–8.0)

## 2014-09-28 LAB — COMPREHENSIVE METABOLIC PANEL
ALBUMIN: 4.1 g/dL (ref 3.5–5.0)
ALT: 18 U/L (ref 14–54)
ANION GAP: 13 (ref 5–15)
AST: 25 U/L (ref 15–41)
Alkaline Phosphatase: 68 U/L (ref 38–126)
BUN: 18 mg/dL (ref 6–20)
CHLORIDE: 102 mmol/L (ref 101–111)
CO2: 27 mmol/L (ref 22–32)
Calcium: 9.4 mg/dL (ref 8.9–10.3)
Creatinine, Ser: 0.68 mg/dL (ref 0.44–1.00)
GFR calc Af Amer: >60 mL/min (ref >60)
GFR calc non Af Amer: >60 mL/min (ref >60)
Glucose, Bld: 106 mg/dL — ABNORMAL HIGH (ref 70–99)
Potassium: 3.4 mmol/L — ABNORMAL LOW (ref 3.5–5.1)
SODIUM: 142 mmol/L (ref 135–145)
TOTAL PROTEIN: 6.6 g/dL (ref 6.5–8.1)
Total Bilirubin: 0.7 mg/dL (ref 0.3–1.2)

## 2014-09-28 LAB — TSH: TSH: 3.177 u[IU]/mL (ref 0.350–4.500)

## 2014-09-28 LAB — D-DIMER, QUANTITATIVE: D-Dimer, Quant: <0.27 ug/mL-FEU (ref 0.00–0.48)

## 2014-09-28 LAB — TROPONIN I
Troponin I: <0.03 ng/mL (ref <0.031)
Troponin I: <0.03 ng/mL (ref <0.031)

## 2014-09-28 LAB — PROTIME-INR
INR: 0.89 (ref 0.00–1.49)
PROTHROMBIN TIME: 12.1 s (ref 11.6–15.2)

## 2014-09-28 MED ORDER — LORAZEPAM 2 MG/ML IJ SOLN
1.0000 mg | Freq: Once | INTRAMUSCULAR | Status: DC
  Filled 2014-09-28: qty 1

## 2014-09-28 MED ORDER — ALBUTEROL SULFATE HFA 108 (90 BASE) MCG/ACT IN AERS: 1.0000 | INHALATION_SPRAY | Freq: Four times a day (QID) | RESPIRATORY_TRACT | Status: DC | PRN

## 2014-09-28 MED ORDER — IBUPROFEN 800 MG PO TABS: 800.0000 mg | ORAL_TABLET | Freq: Three times a day (TID) | ORAL | Status: DC

## 2014-09-28 MED ORDER — PREDNISONE 20 MG PO TABS: ORAL_TABLET | ORAL | Status: DC

## 2014-09-28 MED ORDER — HYDROCODONE-HOMATROPINE 5-1.5 MG/5ML PO SYRP: 5.0000 mL | ORAL_SOLUTION | Freq: Four times a day (QID) | ORAL | Status: DC | PRN

## 2014-09-28 NOTE — Consult Note (Signed)
NEURO HOSPITALIST CONSULT NOTE    Reason for Consult: transient diaphoresis and sensation of feeling "off" with tingling in right hand.   HPI:                                                                                                                                          Amber Peck is an 64 y.o. female who feels at baseline she is very healthy.  Today she was feeling fine when she went to work.  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  She walked into the school and felt "generally weak" and felt as if a normal task was "tougher than usual"  She told her school nurse who tool her BP.  It was systolic in the 130's but she states she usaully runs in the 90's. She also endorses a transient tingling sensation in the right median nerve distribution. She looked up her PCP number and called them.  They advised she come to ED. Currently she is asymptomatic.   She was also worried due to family members having strokes at early age (65's)  Past Medical History  Diagnosis Date  . Cataract   . Anxiety     No past surgical history on file.  Family History  Problem Relation Age of Onset  . Cancer Mother     breast  . Cancer Father   . Cancer Brother   . Cancer Maternal Grandmother     breast  . Heart disease Maternal Grandfather   . Cancer Paternal Grandfather     lung     Social History:  reports that she has never smoked. She does not have any smokeless tobacco history on file. She reports that she drinks alcohol. She reports that she does not use illicit drugs.  Allergies  Allergen Reactions  . Bee Venom     MEDICATIONS:                                                                                                                     No current facility-administered medications for this encounter.   Current Outpatient Prescriptions  Medication Sig Dispense Refill  . DULoxetine (CYMBALTA)  30 MG capsule Take 2 capsules (60  mg total) by mouth 2 (two) times daily. 60 capsule 11  . EPINEPHrine (EPIPEN) 0.3 mg/0.3 mL IJ SOAJ injection Inject 0.3 mLs (0.3 mg total) into the muscle once. 2 Device 2  . [DISCONTINUED] albuterol (PROVENTIL HFA;VENTOLIN HFA) 108 (90 BASE) MCG/ACT inhaler Inhale 1-2 puffs into the lungs every 6 (six) hours as needed for wheezing or shortness of breath. 1 Inhaler 0      ROS:                                                                                                                                       History obtained from the patient  General ROS: negative for - chills, fatigue, fever, night sweats, weight gain or weight loss Psychological ROS: negative for - behavioral disorder, hallucinations, memory difficulties, mood swings or suicidal ideation Ophthalmic ROS: negative for - blurry vision, double vision, eye pain or loss of vision ENT ROS: negative for - epistaxis, nasal discharge, oral lesions, sore throat, tinnitus or vertigo Allergy and Immunology ROS: negative for - hives or itchy/watery eyes Hematological and Lymphatic ROS: negative for - bleeding problems, bruising or swollen lymph nodes Endocrine ROS: negative for - galactorrhea, hair pattern changes, polydipsia/polyuria or temperature intolerance Respiratory ROS: negative for - cough, hemoptysis, shortness of breath or wheezing Cardiovascular ROS: negative for - chest pain, dyspnea on exertion, edema or irregular heartbeat Gastrointestinal ROS: negative for - abdominal pain, diarrhea, hematemesis, nausea/vomiting or stool incontinence Genito-Urinary ROS: negative for - dysuria, hematuria, incontinence or urinary frequency/urgency Musculoskeletal ROS: negative for - joint swelling or muscular weakness Neurological ROS: as noted in HPI Dermatological ROS: negative for rash and skin lesion changes   Blood pressure 137/63, pulse 68, temperature 98 F (36.7 C), temperature source Oral,  resp. rate 16, SpO2 100 %.   Neurologic Examination:                                                                                                      HEENT-  Normocephalic, no lesions, without obvious abnormality.  Normal external eye and conjunctiva.  Normal TM's bilaterally.  Normal auditory canals and external ears. Normal external nose, mucus membranes and septum.  Normal pharynx. Cardiovascular- S1, S2 normal, pulses palpable throughout   Lungs- chest clear, no wheezing, rales, normal symmetric air entry Abdomen- soft, non-tender; bowel sounds normal; no masses,  no organomegaly Extremities- no edema Lymph-no adenopathy palpable Musculoskeletal-no joint tenderness, deformity or swelling Skin-warm and dry, no hyperpigmentation, vitiligo, or suspicious lesions  Neurological Examination Mental Status: Alert, oriented, thought content appropriate.  Speech fluent without evidence of aphasia.  Able to follow 3 step commands without difficulty. Cranial Nerves: II: Discs flat bilaterally; Visual fields grossly normal, pupils equal, round, reactive to light and accommodation III,IV, VI: ptosis not present, extra-ocular motions intact bilaterally V,VII: smile symmetric, facial light touch sensation normal bilaterally VIII: hearing normal bilaterally IX,X: uvula rises symmetrically XI: bilateral shoulder shrug XII: midline tongue extension Motor: Right : Upper extremity   5/5    Left:     Upper extremity   5/5  Lower extremity   5/5     Lower extremity   5/5 Tone and bulk:normal tone throughout; no atrophy noted Sensory: Pinprick and light touch intact throughout, bilaterally Deep Tendon Reflexes: 2+ and symmetric throughout Plantars: Right: downgoing   Left: downgoing Cerebellar: normal finger-to-nose, normal rapid alternating movements and normal heel-to-shin test Gait: normal gait and station      Lab Results: Basic Metabolic Panel:  Recent Labs Lab 09/28/14 0930  NA 142   K 3.4*  CL 102  CO2 27  GLUCOSE 106*  BUN 18  CREATININE 0.68  CALCIUM 9.4    Liver Function Tests:  Recent Labs Lab 09/28/14 0930  AST 25  ALT 18  ALKPHOS 68  BILITOT 0.7  PROT 6.6  ALBUMIN 4.1   No results for input(s): LIPASE, AMYLASE in the last 168 hours. No results for input(s): AMMONIA in the last 168 hours.  CBC:  Recent Labs Lab 09/28/14 0930  WBC 4.8  NEUTROABS 2.2  HGB 13.5  HCT 40.3  MCV 91.2  PLT 177    Cardiac Enzymes:  Recent Labs Lab 09/28/14 0930  TROPONINI <0.03    Lipid Panel: No results for input(s): CHOL, TRIG, HDL, CHOLHDL, VLDL, LDLCALC in the last 168 hours.  CBG: No results for input(s): GLUCAP in the last 168 hours.  Microbiology: No results found for this or any previous visit.  Coagulation Studies:  Recent Labs  09/28/14 0930  LABPROT 12.1  INR 0.89    Imaging: Dg Chest 2 View  09/28/2014   CLINICAL DATA:  Dizziness  EXAM: CHEST  2 VIEW  COMPARISON:  None.  FINDINGS: Mild pulmonary hyperinflation. Negative for pneumonia. Heart size and vascularity normal. Negative for mass or adenopathy. No pleural effusion.  IMPRESSION: No active cardiopulmonary disease.  Mild pulmonary hyperinflation   Electronically Signed   By: Marlan Palauharles  Clark M.D.   On: 09/28/2014 09:55   Ct Head Wo Contrast  09/28/2014   CLINICAL DATA:  64 year old female with episode of confusion, dizziness, diaphoresis, hypertension wall working outside today. Initial encounter.  EXAM: CT HEAD WITHOUT CONTRAST  TECHNIQUE: Contiguous axial images were obtained from the base of the skull through the vertex without intravenous contrast.  COMPARISON:  None.  FINDINGS: Visualized paranasal sinuses and mastoids are clear. No osseous abnormality identified. Negative visualized orbit and scalp soft tissues.  Cerebral volume is within normal limits for age. No suspicious intracranial vascular hyperdensity. No ventriculomegaly. No midline shift, mass effect, or evidence of  intracranial mass lesion. Normal for age gray-white matter differentiation throughout the brain. No evidence of cortically based acute infarction identified. No acute intracranial hemorrhage identified.  IMPRESSION: Normal for age noncontrast CT appearance of the brain.   Electronically Signed   By: Odessa FlemingH  Hall M.D.   On: 09/28/2014 09:57       Assessment and plan per attending neurologist  Felicie Mornavid Smith PA-C [facility] [phone]  09/28/2014, 1:31  PM   Assessment/Plan: 64 yo F with transient generalized weakness and "foggy headedness" without clear disability followed by transient tingling in only the anterior aspect of her radial three digits on her right hand. The presence of positivie symptoms and the distribution in the hand suggest that this is liekly peripheral rather than central. The other symptom of generalized weakness is very non-specific. At thsi time, I think it would not be unreasonable to obtain an MRI brain, but if negative do not think that TIA/stroke workup would be indicated based on this history. Other possibilites including hypoglycemia, psychosomatic, dehydration could be considered.   1) MRI brain 2) would not be unreasonable given her family history to take daily baby asa   Ritta SlotMcNeill Arlie Posch, MD [facility] [phone]  If 7pm- 7am, please page neurology on call as listed in [facility].

## 2014-09-28 NOTE — ED Notes (Signed)
Doctor and nurse spoke to patient and patient refusing further testing. Understands verbalizes risk and benefits and possible death.

## 2014-09-28 NOTE — ED Notes (Signed)
Pt refusing MRI at this time while RN trying to administer ativan IV. Pt states she has to go and can reschedule, this Rn explained importance to pt and pt still declined. Pt transported back to room by this Rn and Dr. Karma GanjaLinker notified, Dr. Karma GanjaLinker at bedside.

## 2014-09-28 NOTE — ED Provider Notes (Signed)
1:06 PM Pt seen in transfer [facility].  She remains asymptomatic.  Her neuro exam is normal.  I have called Dr. Jobe MarkerKirkpatric, neurology- they will come to see her in the ED.    2:29 PM neurology has seen patient and recommends MR brain and MRA head.  If these are negative, patient may be discharged  2:53 PM pt went over for MRI and states she is very anxious and not able to stay for the MRI.  Ativan was ordered.  She states she doesn't want to have the ativan and would rather schedule the MRI for another day.  I have talked with her and asked if she would be willing to take the ativan to see if it helped her anxiety and possibly proceed with the MRI.  She does not want to try this.  I have discussed with her that this is the only way to r/o stroke in her case- she will need to sign out AMA.  She is in agreement with this and will contanct her PMD to schedule MRI.  She was advised that she can return to the ED at any time if symptoms worsen or she changes her mind.    Jerelyn ScottMartha Linker, MD 09/28/14 1455

## 2014-09-28 NOTE — ED Notes (Signed)
Report given to Mike EMT-P with [facility]. 

## 2014-09-28 NOTE — ED Notes (Signed)
[facility] E[facility]. [facility] hand-off: Working in the yard this morning, had confusion, dizziness and numbness in fingertips that lasted for an hour. [facility]; positive orthostatics there, symptoms resolved after [facility]. VSS. All symptoms resolved on arrival.

## 2014-09-28 NOTE — ED Notes (Signed)
[facility] here to transport pt to [facility] 

## 2014-09-28 NOTE — ED Provider Notes (Signed)
CSN: [id number]     Arrival date & time 09/28/14  0909 History   First MD Initiated Contact with Patient 09/28/14 0920     Chief Complaint  Patient presents with  . Dizziness     (Consider location/radiation/quality/duration/timing/severity/associated sxs/prior Treatment) HPI The patient reports she has no medical problems. She is healthy at baseline. She awoke this morning without any symptoms. She works at a [employer]. She had gone outside to walk in their garden for about 20 minutes. She walked back in and sat down at her desk and suddenly felt very clammy and things just didn't feel right. She can't really identify any pain in association with this. There was no chest pain. With specific questioning she did endorse some mild temporal headache. She asked to the school nurse to check her blood pressure which normally runs very low, on the order of systolics in the 90s. She reports that the blood pressure was 140s over 80s which was atypically high for her. She can't identify a specific symptom in association with this aside from feeling concerned and mildly confused. She states that she was able to find her family doctor's phone number and make call but doing these tasks seemed more difficult than it normally would. It was no visual deficit or loss. There was no focal extremity dysfunction. The patient does identify that as she was being driven over here by her companion, she noted tingling that started in her right fingertips. The tingling sensation waxed and waned several times. This did not progress to any weakness or motor dysfunction and no other localizing neurologic symptoms developed. Family history is for a cousin who had an early onset of stroke, no first-degree family members. Also some second-degree family members with early coronary artery disease. The patient is a nonsmoker, no known history of hyperlipidemia, no history of hypertension. Past Medical History  Diagnosis Date  .  Cataract   . Anxiety    No past surgical history on file. Family History  Problem Relation Age of Onset  . Cancer Mother     breast  . Cancer Father   . Cancer Brother   . Cancer Maternal Grandmother     breast  . Heart disease Maternal Grandfather   . Cancer Paternal Grandfather     lung   History  Substance Use Topics  . Smoking status: Never Smoker   . Smokeless tobacco: Not on file  . Alcohol Use: Yes     Comment: 2-3 glasses of wine or 2-3 beers per week   OB History    No data available     Review of Systems 10 Systems reviewed and are negative for acute change except as noted in the HPI.    Allergies  Bee venom  Home Medications   Prior to Admission medications   Medication Sig Start Date End Date Taking? Authorizing Provider  DULoxetine (CYMBALTA) 30 MG capsule Take 2 capsules (60 mg total) by mouth 2 (two) times daily. 01/08/14  Yes Elvina Sidle, MD  EPINEPHrine (EPIPEN) 0.3 mg/0.3 mL IJ SOAJ injection Inject 0.3 mLs (0.3 mg total) into the muscle once. 01/08/14  Yes Elvina Sidle, MD   BP 131/72 mmHg  Pulse 60  Resp 16  SpO2 100% Physical Exam  Constitutional: She is oriented to person, place, and time. She appears well-developed and well-nourished.  HENT:  Head: Normocephalic and atraumatic.  Eyes: EOM are normal. Pupils are equal, round, and reactive to light.  Neck: Neck supple.  Cardiovascular:  Normal rate, regular rhythm, normal heart sounds and intact distal pulses.   Pulmonary/Chest: Effort normal and breath sounds normal.  Abdominal: Soft. Bowel sounds are normal. She exhibits no distension. There is no tenderness.  Musculoskeletal: Normal range of motion. She exhibits no edema.  Neurological: She is alert and oriented to person, place, and time. She has normal strength. No cranial nerve deficit. She exhibits normal muscle tone. Coordination normal. GCS eye subscore is 4. GCS verbal subscore is 5. GCS motor subscore is 6.  Patient is alert  and oriented 3 with normal cognitive function. Speech is clear. Her 2 through 12 are intact. No pronator drift. Normal finger nose examination and normal heel shin examination bilaterally. 5 upper extremity strength and lower extremity strength. Sensation intact to light touch. No visual field deficit.  Skin: Skin is warm, dry and intact.  Psychiatric: She has a normal mood and affect.    ED Course  Procedures (including critical care time) Labs Review Labs Reviewed  COMPREHENSIVE METABOLIC PANEL - Abnormal; Notable for the following:    Potassium 3.4 (*)    Glucose, Bld 106 (*)    All other components within normal limits  TROPONIN I  CBC WITH DIFFERENTIAL/PLATELET  D-DIMER, QUANTITATIVE  PROTIME-INR  URINALYSIS, ROUTINE W REFLEX MICROSCOPIC  TSH    Imaging Review Dg Chest 2 View  09/28/2014   CLINICAL DATA:  Dizziness  EXAM: CHEST  2 VIEW  COMPARISON:  None.  FINDINGS: Mild pulmonary hyperinflation. Negative for pneumonia. Heart size and vascularity normal. Negative for mass or adenopathy. No pleural effusion.  IMPRESSION: No active cardiopulmonary disease.  Mild pulmonary hyperinflation   Electronically Signed   By: Marlan Palauharles  Clark M.D.   On: 09/28/2014 09:55   Ct Head Wo Contrast  09/28/2014   CLINICAL DATA:  64 year old female with episode of confusion, dizziness, diaphoresis, hypertension wall working outside today. Initial encounter.  EXAM: CT HEAD WITHOUT CONTRAST  TECHNIQUE: Contiguous axial images were obtained from the base of the skull through the vertex without intravenous contrast.  COMPARISON:  None.  FINDINGS: Visualized paranasal sinuses and mastoids are clear. No osseous abnormality identified. Negative visualized orbit and scalp soft tissues.  Cerebral volume is within normal limits for age. No suspicious intracranial vascular hyperdensity. No ventriculomegaly. No midline shift, mass effect, or evidence of intracranial mass lesion. Normal for age gray-white matter  differentiation throughout the brain. No evidence of cortically based acute infarction identified. No acute intracranial hemorrhage identified.  IMPRESSION: Normal for age noncontrast CT appearance of the brain.   Electronically Signed   By: Odessa FlemingH  Hall M.D.   On: 09/28/2014 09:57     EKG Interpretation   Date/Time:  Wednesday Sep 28 2014 09:22:15 EDT Ventricular Rate:  62 PR Interval:  142 QRS Duration: 66 QT Interval:  414 QTC Calculation: 420 R Axis:   74 Text Interpretation:  Normal sinus rhythm Septal infarct , age  undetermined Abnormal ECG No STEMI, borderline poor R wave progression  Confirmed by Donnald GarrePfeiffer, MD, Lebron ConnersMarcy [id number]) on 09/28/2014 9:48:50 AM     Consult Amada JupiterKirkpatrick 09:46 MDM   Final diagnoses:  Paresthesia of right upper extremity  Weakness  Diaphoresis   Patient developed an atypical episode of weakness with mild confusion and waxing and waning right upper extremity paresthesia. At this time concern is for possible TIA type symptoms. The patient's case has been reviewed with Dr. Amada JupiterKirkpatrick and the [facility] for neurology consultation. The patient has been  stable since her arrival to the emergency department at [facility] and no focal motor deficit has been identified.    Arby BarretteMarcy Charae Depaolis, MD 09/28/14 1046

## 2014-09-28 NOTE — ED Notes (Signed)
Patient changed into a gown from waist up.  

## 2014-09-28 NOTE — ED Notes (Addendum)
States she was working outside and came back  In and felt confused and felt clammy.  Slight temporal h/a. Finger tips of right hand had tingling feeling.

## 2014-09-28 NOTE — ED Notes (Signed)
[facility].

## 2014-11-04 ENCOUNTER — Ambulatory Visit (INDEPENDENT_AMBULATORY_CARE_PROVIDER_SITE_OTHER): Payer: BC Managed Care – PPO | Admitting: Family Medicine

## 2014-11-04 VITALS — BP 104/60 | HR 80 | Temp 97.9°F | Resp 16 | Ht 65.5 in | Wt 117.4 lb

## 2014-11-04 DIAGNOSIS — M8580 Other specified disorders of bone density and structure, unspecified site: Secondary | ICD-10-CM | POA: Diagnosis not present

## 2014-11-04 DIAGNOSIS — F39 Unspecified mood [affective] disorder: Secondary | ICD-10-CM | POA: Diagnosis not present

## 2014-11-04 LAB — COMPREHENSIVE METABOLIC PANEL
ALT: 16 U/L (ref 0–35)
AST: 22 U/L (ref 0–37)
Albumin: 4 g/dL (ref 3.5–5.2)
Alkaline Phosphatase: 62 U/L (ref 39–117)
BUN: 18 mg/dL (ref 6–23)
CO2: 30 mEq/L (ref 19–32)
Calcium: 9 mg/dL (ref 8.4–10.5)
Chloride: 102 mEq/L (ref 96–112)
Creat: 0.71 mg/dL (ref 0.50–1.10)
Glucose, Bld: 72 mg/dL (ref 70–99)
Potassium: 4.4 mEq/L (ref 3.5–5.3)
Sodium: 140 mEq/L (ref 135–145)
Total Bilirubin: 0.4 mg/dL (ref 0.2–1.2)
Total Protein: 6.4 g/dL (ref 6.0–8.3)

## 2014-11-04 LAB — THYROID PANEL WITH TSH
Free Thyroxine Index: 1.7 (ref 1.4–3.8)
T3 Uptake: 29 % (ref 22–35)
T4, Total: 6 ug/dL (ref 4.5–12.0)
TSH: 1.035 u[IU]/mL (ref 0.350–4.500)

## 2014-11-04 LAB — FSH/LH
FSH: 169.9 m[IU]/mL — ABNORMAL HIGH
LH: 57 m[IU]/mL

## 2014-11-04 MED ORDER — DULOXETINE HCL 30 MG PO CPEP: 60.0000 mg | ORAL_CAPSULE | Freq: Every day | ORAL | Status: DC

## 2014-11-04 NOTE — Progress Notes (Signed)
° °  Subjective:    Patient ID: Amber Peck, female    DOB: 1950-07-23, 64 y.o.   MRN: [id number] This chart was scribed for Amber Sidle, MD by Littie Deeds, Medical Scribe. This patient was seen in Room 10 and the patient's care was started at 8:58 AM.   HPI HPI Comments: EMPERATRIZ Peck is a 64 y.o. female with a hx of osteoporosis who presents to the [facility] requesting a referral to endocrinology. Patient has had some increased mild left hip pain that is sharp on occasion which she attributes to weakened bones. She is concerned about falling and fracturing bones. She did have bone scans of her left hip and back. She feels that treatments for pain are not necessary and wants to strengthen her bones through hormonal levels and blood sugar (she states her blood sugar is typically low). She is hypoglycemic at times, but is able to manage this on her own. Patient would like to be followed by an endocrinologist to closely monitor her hormone levels.  Patient is on duloxetine (two tablets, once a day) and Ativan. She had been prescribed Ativan to help her sleep, but would like to come off of it. She ran out of Ativan about 2 months ago and has been having some difficulty sleeping. She thinks her sleep may be improved now since she retired 2 days ago.  Patient retired 2 days ago. 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  She is not fasting.  Review of Systems  Musculoskeletal: Positive for arthralgias.  Psychiatric/Behavioral: Positive for sleep disturbance.       Objective:   Physical Exam CONSTITUTIONAL: Well developed. Thin woman, alert and appropriate. HEAD: Normocephalic/atraumatic EYES: EOM/PERRL ENMT: Mucous membranes moist NECK: supple no meningeal signs SPINE: entire spine nontender CV: S1/S2 noted, no murmurs/rubs/gallops noted LUNGS: Lungs are clear to auscultation bilaterally, no apparent distress ABDOMEN: soft, nontender, no  rebound or guarding GU: no cva tenderness NEURO: Pt is awake/alert, moves all extremitiesx4 EXTREMITIES: pulses normal, full ROM SKIN: warm, color normal PSYCH: no abnormalities of mood noted     Assessment & Plan:   This chart was scribed in my presence and reviewed by me personally.    ICD-9-CM ICD-10-CM   1. Osteopenia 733.90 M85.80 FSH/LH     Comprehensive metabolic panel     Thyroid Panel With TSH     Ambulatory referral to Endocrinology  2. Affective disorder 296.90 F39 DULoxetine (CYMBALTA) 30 MG capsule     Signed, Amber Sidle, MD

## 2014-11-04 NOTE — Patient Instructions (Signed)
I will be referring you to the endocrinologist and he should get a call early next week with an appointment time.

## 2014-12-21 ENCOUNTER — Encounter: Payer: Self-pay | Admitting: Internal Medicine

## 2014-12-21 ENCOUNTER — Ambulatory Visit (INDEPENDENT_AMBULATORY_CARE_PROVIDER_SITE_OTHER): Payer: BC Managed Care – PPO | Admitting: Internal Medicine

## 2014-12-21 VITALS — BP 100/64 | HR 73 | Temp 98.0°F | Resp 16 | Ht 66.0 in | Wt 112.0 lb

## 2014-12-21 DIAGNOSIS — M810 Age-related osteoporosis without current pathological fracture: Secondary | ICD-10-CM

## 2014-12-21 LAB — BASIC METABOLIC PANEL WITH GFR
BUN: 17 mg/dL (ref 7–25)
CALCIUM: 8.8 mg/dL (ref 8.6–10.4)
CO2: 23 mmol/L (ref 20–31)
CREATININE: 0.68 mg/dL (ref 0.50–0.99)
Chloride: 104 mmol/L (ref 98–110)
GLUCOSE: 88 mg/dL (ref 65–99)
POTASSIUM: 4.5 mmol/L (ref 3.5–5.3)
SODIUM: 140 mmol/L (ref 135–146)

## 2014-12-21 LAB — VITAMIN D 25 HYDROXY (VIT D DEFICIENCY, FRACTURES): VITD: 50.55 ng/mL (ref 30.00–100.00)

## 2014-12-21 NOTE — Patient Instructions (Signed)
Please stop at the lab.  We will schedule another DEXA scan for you.  As soon as the results are here, will start the pre-authorization for Reclast.  Zoledronic acid: Patient drug information (Up-to-Date) Copyright 5045982048 [employer] rights reserved.  Brand Names: [place]  Reclast;  Zometa What is this drug used for?  .It is used to treat high calcium levels.  .It is used when treating some cancers.  .It is used to treat Paget's disease.  .It is used to put off or treat soft, brittle bones (osteoporosis).  .It may be given to you for other reasons. Talk with the doctor. What do I need to tell my doctor BEFORE I take this drug?  All products:  .If you have an allergy to zoledronic acid or any other part of this drug.  .If you are allergic to any drugs like this one, any other drugs, foods, or other substances. Tell your doctor about the allergy and what signs you had, like rash; hives; itching; shortness of breath; wheezing; cough; swelling of face, lips, tongue, or throat; or any other signs.  Reclast:  .If you have low calcium levels.  .If you have very bad kidney disease.  This is not a list of all drugs or health problems that interact with this drug.  Tell your doctor and pharmacist about all of your drugs (prescription or OTC, natural products, vitamins) and health problems. You must check to make sure that it is safe for you to take this drug with all of your drugs and health problems. Do not start, stop, or change the dose of any drug without checking with your doctor. What are some things I need to know or do while I take this drug?  All products:  .Tell dentists, surgeons, and other doctors that you use this drug.  .Worsening of asthma has happened in people taking drugs like this one. Talk with your doctor.  .This drug may raise the chance of a broken leg. Talk with your doctor.  .Have your blood work checked often. Talk with your doctor.  .Have a bone density test.  Talk with your doctor.  .Have a dental exam before starting this drug.  .Take good care of your teeth. See a dentist often.  .Do not give to a child. Talk with your doctor.  .If you are 56 or older, use this drug with care. You could have more side effects.  .This drug may cause harm to the unborn baby if you take it while you are pregnant.  .Tell your doctor if you are pregnant or plan on getting pregnant. You will need to talk about the benefits and risks of using this drug while you are pregnant.  .Tell your doctor if you are breast-feeding. You will need to talk about any risks to your baby.  Zometa:  .Take calcium and vitamin D as you were told by your doctor.  Reclast:  .This drug works best when used with calcium/vitamin D and weight-bearing workouts like walking or PT (physical therapy).  .Follow the diet and workout plan that your doctor told you about.  .Use birth control that you can trust to prevent pregnancy while taking this drug. What are some side effects that I need to call my doctor about right away?  WARNING/CAUTION: Even though it may be rare, some people may have very bad and sometimes deadly side effects when taking a drug. Tell your doctor or get medical help right away if you have any of  the following signs or symptoms that may be related to a very bad side effect:  .Signs of an allergic reaction, like rash; hives; itching; red, swollen, blistered, or peeling skin with or without fever; wheezing; tightness in the chest or throat; trouble breathing or talking; unusual hoarseness; or swelling of the mouth, face, lips, tongue, or throat.  .Signs of low calcium levels like muscle cramps or spasms, numbness and tingling, or seizures.  .Signs of kidney problems like unable to pass urine, change in the amount of urine passed, blood in the urine, or a big weight gain.  .Very bad bone, joint, or muscle pain.  .Any new or strange groin, hip, or thigh pain.  .Chest pain.  .A  heartbeat that does not feel normal.  .Slow heartbeat.  .Change in eyesight.  .Eye pain.  .Mouth sores.  .Trouble swallowing.  .Very bad pain when swallowing.  .Any bruising or bleeding.  .Pain where the shot was given.  .Redness or swelling where the shot is given.  .This drug may cause jawbone problems. The chance may be higher the longer you take this drug. The chance may be higher if you have cancer, dental problems, dentures that do not fit well, anemia, blood clotting problems, or an infection. The chance may also be higher if you are having dental work or if you are getting chemo, some steroid drugs, or radiation. Call your doctor right away if you have jaw swelling or pain. What are some other side effects of this drug?  All drugs may cause side effects. However, many people have no side effects or only have minor side effects. Call your doctor or get medical help if any of these side effects or any other side effects bother you or do not go away:  All products:  .Dizziness.  Marland KitchenUpset stomach or throwing up.  .Irritation where the shot is given.  .Feeling tired or weak.  .Belly pain.  Marland KitchenHeadache.  .Flu-like signs.  .Loose stools (diarrhea).  .Muscle or joint pain.  .Back pain.  Zometa:  .Not able to sleep.  .Not hungry.  .Hard stools (constipation).  .Weight loss.  .Cough.  These are not all of the side effects that may occur. If you have questions about side effects, call your doctor. Call your doctor for medical advice about side effects.  You may report side effects to your national health agency. How is this drug best taken?  Use this drug as ordered by your doctor. Read and follow the dosing on the label closely.  All products:  .It is given as a shot into a vein over a period of time.  .Drink lots of noncaffeine liquids unless told to drink less liquid by your doctor.  Reclast:  .Acetaminophen may be given to lower fever and chills.  .Drink at least 2 glasses of  liquids a few hours before you get this drug. What do I do if I miss a dose?  .Call the doctor to find out what to do. How do I store and/or throw out this drug?  Marland KitchenThis drug will be given to you in a hospital or doctor's office. You will not store it at home.  Marland KitchenKeep all drugs out of the reach of children and pets.  .Check with your pharmacist about how to throw out unused drugs.  General drug facts  .If your symptoms or health problems do not get better or if they become worse, call your doctor.  .Do not share your drugs with others and do  not take anyone else's drugs.  Marland KitchenKeep a list of all your drugs (prescription, natural products, vitamins, OTC) with you. Give this list to your doctor.  .Talk with the doctor before starting any new drug, including prescription or OTC, natural products, or vitamins.  .Some drugs may have another patient information leaflet. If you have any questions about this drug, please talk with your doctor, pharmacist, or other health care provider.  .If you think there has been an overdose, call your poison control center or get medical care right away. Be ready to tell or show what was taken, how much, and when it happened.  How Can I Prevent Falls? Men and women with osteoporosis need to take care not to fall down. Falls can break bones. Some reasons people fall are: Poor vision  Poor balance  Certain diseases that affect how you walk  Some types of medicine, such as sleeping pills.  Some tips to help prevent falls outdoors are: Use a cane or walker  Wear rubber-soled shoes so you don't slip  Walk on grass when sidewalks are slippery  In winter, put salt or kitty litter on icy sidewalks.  Some ways to help prevent falls indoors are: Keep rooms free of clutter, especially on floors  Use plastic or carpet runners on slippery floors  Wear low-heeled shoes that provide good support  Do not walk in socks, stockings, or slippers  Be sure carpets and area rugs have  skid-proof backs or are tacked to the floor  Be sure stairs are well lit and have rails on both sides  Put grab bars on bathroom walls near tub, shower, and toilet  Use a rubber bath mat in the shower or tub  Keep a flashlight next to your bed  Use a sturdy step stool with a handrail and wide steps  Add more lights in rooms (and night lights) Buy a cordless phone to keep with you so that you don't have to rush to the phone       when it rings and so that you can call for help if you fall.   (adapted from http://www.niams.NightlifePreviews.se)  Dietary sources of calcium and vitamin D:  Calcium content (mg) - http://www.niams.MoviePins.co.za  Fortified oatmeal, 1 packet 350  Sardines, canned in oil, with edible bones, 3 oz. 324  Cheddar cheese, 1 oz. shredded 306  Milk, nonfat, 1 cup 302  Milkshake, 1 cup 300  Yogurt, plain, low-fat, 1 cup 300  Soybeans, cooked, 1 cup 261  Tofu, firm, with calcium,  cup 204  Orange juice, fortified with calcium, 6 oz. 200-260 (varies)  Salmon, canned, with edible bones, 3 oz. 181  Pudding, instant, made with 2% milk,  cup 153  Baked beans, 1 cup [place], 1% milk fat, 1 cup 138  Spaghetti, lasagna, 1 cup 125  Frozen yogurt, vanilla, soft-serve,  cup 103  Ready-to-eat cereal, fortified with calcium, 1 cup 100-1,000 (varies)  Cheese pizza, 1 slice 536  Fortified waffles, 2 100  Turnip greens, boiled,  cup 99  Broccoli, raw, 1 cup 90  Ice cream, vanilla,  cup 85  Soy or rice milk, fortified with calcium, 1 cup 80-500 (varies)   Vitamin D content (International Units, IU) - https://www.ars.usda.gov Cod liver oil, 1 tablespoon 1,360  Swordfish, cooked, 3 oz 566  Salmon (sockeye), cooked, 3 oz 447  Tuna fish, canned in water, drained, 3 oz 154  Orange juice fortified with vitamin D, 1 cup (check product labels, as amount  of added vitamin D varies) 137  Milk, nonfat,  reduced fat, and whole, vitamin D-fortified, 1 cup 115-124  Yogurt, fortified with 20% of the daily value for vitamin D, 6 oz 80  Margarine, fortified, 1 tablespoon 60  Sardines, canned in oil, drained, 2 sardines 46  Liver, beef, cooked, 3 oz 42  Egg, 1 large (vitamin D is found in yolk) 41  Ready-to-eat cereal, fortified with 10% of the daily value for vitamin D, 0.75-1 cup  40  Cheese, Swiss, 1 oz 6   Exercise for Strong Bones (from [place]) There are two types of exercises that are important for building and maintaining bone density:  weight-bearing and muscle-strengthening exercises. Weight-bearing Exercises These exercises include activities that make you move against gravity while staying upright. Weight-bearing exercises can be high-impact or low-impact. High-impact weight-bearing exercises help build bones and keep them strong. If you have broken a bone due to osteoporosis or are at risk of breaking a bone, you may need to avoid high-impact exercises. If you're not sure, you should check with your healthcare provider. Examples of high-impact weight-bearing exercises are: . Dancing . Doing high-impact aerobics . Hiking . Jogging/running . Jumping Rope . Stair climbing . Tennis Low-impact weight-bearing exercises can also help keep bones strong and are a safe alternative if you cannot do high-impact exercises. Examples of low-impact weight-bearing exercises are: . Using elliptical training machines . Doing low-impact aerobics . Using stair-step machines . Fast walking on a treadmill or outside Muscle-Strengthening Exercises These exercises include activities where you move your body, a weight or some other resistance against gravity. They are also known as resistance exercises and include: . Lifting weights . Using elastic exercise bands . Using weight machines . Lifting your own body weight . Functional movements, such as standing and rising up on  your toes Yoga and Pilates can also improve strength, balance and flexibility. However, certain positions may not be safe for people with osteoporosis or those at increased risk of broken bones. For example, exercises that have you bend forward may increase the chance of breaking a bone in the spine. A physical therapist should be able to help you learn which exercises are safe and appropriate for you. Non-Impact Exercises Non-impact exercises can help you to improve balance, posture and how well you move in everyday activities. These exercises can also help to increase muscle strength and decrease the risk of falls and broken bones. Some of these exercises include: . Balance exercises that strengthen your legs and test your balance, such as Tai Chi, can decrease your risk of falls. . Posture exercises that improve your posture and reduce rounded or "sloping" shoulders can help you decrease the chance of breaking a bone, especially in the spine. . Functional exercises that improve how well you move can help you with everyday activities and decrease your chance of falling and breaking a bone. For example, if you have trouble getting up from a chair or climbing stairs, you should do these activities as exercises. A physical therapist can teach you balance, posture and functional exercises. Starting a New Exercise Program If you haven't exercised regularly for a while, check with your healthcare provider before beginning a new exercise program-particularly if you have health problems such as heart disease, diabetes or high blood pressure. If you're at high risk of breaking a bone, you should work with a physical therapist to develop a safe exercise program. Once you have your healthcare provider's approval, start slowly. If  you've already broken bones in the spine because of osteoporosis, be very careful to avoid activities that require reaching down, bending forward, rapid twisting motions, heavy lifting and  those that increase your chance of a fall. As you get started, your muscles may feel sore for a day or two after you exercise. If soreness lasts longer, you may be working too hard and need to ease up. Exercises should be done in a pain-free range of motion. How Much Exercise Do You Need? Weight-bearing exercises 30 minutes on most days of the week. Do a 30-minutesession or multiple sessions spread out throughout the day. The benefits to your bones are the same.   Muscle-strengthening exercises Two to three days per week. If you don't have much time for strengthening/resistance training, do small amounts at a time. You can do just one body part each day. For example do arms one day, legs the next and trunk the next. You can also spread these exercises out during your normal day.  Balance, posture and functional exercises Every day or as often as needed. You may want to focus on one area more than the others. If you have fallen or lose your balance, spend time doing balance exercises. If you are getting rounded shoulders, work more on posture exercises. If you have trouble climbing stairs or getting up from the couch, do more functional exercises. You can also perform these exercises at one time or spread them during your day. Work with a phyiscal therapist to learn the right exercises for you.

## 2014-12-21 NOTE — Progress Notes (Addendum)
Patient ID: Amber Peck, female   DOB: 04/11/1951, 64 y.o.   MRN: [id number]   HPI  Amber Peck is a 64 y.o.-year-old female, referred by her PCP, Dr. Joseph Art, for management of osteoporosis.  Pt was dx with OP in 2014. She denies recent fractures or falls. Had an elbow fx (traumatic - rollerskating) at 64 y/o.  No dizziness/vertigo/orthostasis.  I reviewed pt's DEXA scans: Date L1-L4 T score FN T score  12/09/2012 ([facility]) - 1.2 (-9.5%*) LFN: - 2.9 RFN: n/a  06/30/2002 - 0.3 LFN: - 2.2   She has not been on OP treatments before.  No h/o vitamin D deficiency. Last vit D level was 108 in 01/08/2014.  Pt is on calcium citrate and vitamin D 1-2 a day. She also eats dairy and green, leafy, vegetables.   No weight bearing exercises. She was running before, not anymore. She just retired. She walks 20-30 min or 1h a day if cooler outside. She [place] a lot of time at the beach.   She does not take high vitamin A doses.  No h/o hyper/hypocalcemia. No h/o hyperparathyroidism. No h/o kidney stones. Lab Results  Component Value Date   PTH 55.0 06/15/2013   CALCIUM 9.0 11/04/2014   CALCIUM 9.4 09/28/2014   CALCIUM 8.6 01/08/2014   CALCIUM 9.3 06/15/2013   CALCIUM 9.2 11/08/2012   No h/o thyrotoxicosis. Reviewed TSH recent levels:  Lab Results  Component Value Date   TSH 1.035 11/04/2014   TSH 3.177 09/28/2014   TSH 1.433 01/08/2014   TSH 1.678 06/15/2013   No h/o CKD. Last BUN/Cr: Lab Results  Component Value Date   BUN 18 11/04/2014   CREATININE 0.71 11/04/2014   Menopause was in her 64s.   Pt does have a FH of osteoporosis >> mother: hip fx >> on Fosamax, then iv Meds >> improved.  ROS: Constitutional: no weight gain/loss, no fatigue, no subjective hyperthermia/hypothermia, + poor sleep Eyes: no blurry vision, no xerophthalmia ENT: no sore throat, no nodules palpated in throat, no dysphagia/odynophagia, no hoarseness, + decreased hearing, +  tinnitus Cardiovascular: no CP/SOB/palpitations/leg swelling Respiratory: no cough/SOB Gastrointestinal: no N/V/D/C Musculoskeletal: no muscle/+ joint aches Skin: no rashes, + hair loss Neurological: no tremors/numbness/tingling/dizziness Psychiatric: no depression/anxiety + low libido  Past Medical History  Diagnosis Date  . Cataract   . Anxiety    No past surgical history on file.   History   Social History  . Marital Status: Single    Spouse Name: N/A  . Number of Children: 0   Occupational History  . Retired Engineer, mining   Social History Main Topics  . Smoking status: Never Smoker   . Smokeless tobacco: Not on file  . Alcohol Use: Yes     Comment: 1 glass of wine 2-3x a mo  . Drug Use: No   Current Outpatient Prescriptions on File Prior to Visit  Medication Sig Dispense Refill  . DULoxetine (CYMBALTA) 30 MG capsule Take 2 capsules (60 mg total) by mouth daily. (Patient taking differently: Take 10 mg by mouth daily. ) 60 capsule 11  . EPINEPHrine (EPIPEN) 0.3 mg/0.3 mL IJ SOAJ injection Inject 0.3 mLs (0.3 mg total) into the muscle once. 2 Device 2  . [DISCONTINUED] albuterol (PROVENTIL HFA;VENTOLIN HFA) 108 (90 BASE) MCG/ACT inhaler Inhale 1-2 puffs into the lungs every 6 (six) hours as needed for wheezing or shortness of breath. 1 Inhaler 0   No current facility-administered medications on file prior to visit.  Allergies  Allergen Reactions  . Bee Venom    Family History  Problem Relation Age of Onset  . Cancer Mother     breast  . Cancer Father   . Cancer Brother   . Cancer Maternal Grandmother     breast  . Heart disease Maternal Grandfather   . Cancer Paternal Grandfather     lung  Also: - HTN in brother, father, GM - HL in mother, GM - heart ds - GM  PE: BP 100/64 mmHg  Pulse 73  Temp(Src) 98 F (36.7 C) (Oral)  Resp 16  Ht '5\' 6"'  (1.676 m)  Wt 112 lb (50.803 kg)  BMI 18.09 kg/m2  SpO2 98% Wt Readings from Last 3 Encounters:  12/21/14 112  lb (50.803 kg)  11/04/14 117 lb 6.4 oz (53.252 kg)  01/08/14 115 lb 9.6 oz (52.436 kg)   Constitutional: normal weight, in NAD. No kyphosis. Eyes: PERRLA, EOMI, no exophthalmos ENT: moist mucous membranes, no thyromegaly, no cervical lymphadenopathy Cardiovascular: RRR, No MRG Respiratory: CTA B Gastrointestinal: abdomen soft, NT, ND, BS+ Musculoskeletal: no deformities, strength intact in all 4 Skin: moist, warm, no rashes Neurological: no tremor with outstretched hands, DTR normal in all 4  Assessment: 1. Osteoporosis  Plan: 1. Osteoporosis - likely postmenopausal  - Discussed about increased risk of fracture, depending on the T score, greatly increased when the T score is lower than -2.5, but it is actually a continuum and -2.5 should not be regarded as an absolute threshold. We reviewed her DEXA scan report together, and I explained that based on the T scores, she has an increased risk for fractures.  - recommended dietary and supplemental calcium and vitamin D intake: at least 2000 units vitamin D at least 1200 mg of calcium daily - given her specific instructions about food sources for these - see pt instructions  - discussed fall precautions   - given handout from [place] Re: weight bearing exercises - advised to do this every day or at least 5/7 days - we discussed about maintaining a good amount of protein in her diet. The recommended daily protein intake is ~0.8 g per kilogram per day (40 g per day for her). I advised her to try to aim for this amount, since a diet low in proteins can exacerbate osteoporosis. Also, avoid smoking or >2 drinks of alcohol a day. - We discussed about the different medication classes, benefits and side effects of each (including atypical fractures and ONJ - no dental workup in progress or planned).  - I explained that my first choice would be IV bisphosphonate, zoledronic acid (iv Reclast) (which is her choice due to the fact  that her mother had a lot of benefit from it) or sq denosumab (Prolia). I don't think she is a candidate for Teriparatide at the moment. Pt was given reading information about Reclast. - We will check:  Vitamin D  BMP - will check a new DEXA scan since she is 2 years after the previous - after results are back, will arrange for a Reclast infusion - will see pt back in a year  - time spent with the patient: 1 hour, of which >50% was spent in obtaining information about her osteoporosis, reviewing her previous labs, evaluations, and treatments, counseling her about her condition (please see the discussed topics above), and developing a plan to further investigate it; she had a number of questions which I addressed.  Office Visit on 12/21/2014  Component  Date Value Ref Range Status  . VITD 12/21/2014 50.55  30.00 - 100.00 ng/mL Final  . Sodium 12/21/2014 140  135 - 146 mmol/L Final  . Potassium 12/21/2014 4.5  3.5 - 5.3 mmol/L Final  . Chloride 12/21/2014 104  98 - 110 mmol/L Final  . CO2 12/21/2014 23  20 - 31 mmol/L Final  . Glucose, Bld 12/21/2014 88  65 - 99 mg/dL Final  . BUN 12/21/2014 17  7 - 25 mg/dL Final  . Creat 12/21/2014 0.68  0.50 - 0.99 mg/dL Final  . Calcium 12/21/2014 8.8  8.6 - 10.4 mg/dL Final  . GFR, Est African American 12/21/2014 >89  >=60 mL/min Final  . GFR, Est Non African American 12/21/2014 >89  >=60 mL/min Final   Comment:   The estimated GFR is a calculation valid for adults (>=68 years old) that uses the CKD-EPI algorithm to adjust for age and sex. It is   not to be used for children, pregnant women, hospitalized patients,    patients on dialysis, or with rapidly changing kidney function. According to the NKDEP, eGFR >89 is normal, 60-89 shows mild impairment, 30-59 shows moderate impairment, 15-29 shows severe impairment and <15 is ESRD.     Footnotes:  (1) ** Please note change in unit of measure and reference range(s). **      BMP and vitamin D  normal. Continue with plan to start Reclast. CLINICAL DATA: Postmenopausal. Followup for osteoporosis. History of left forearm and humerus fractures.  EXAM: DUAL X-RAY ABSORPTIOMETRY (DXA) FOR BONE MINERAL DENSITY  FINDINGS: AP LUMBAR SPINE L1, L3 and L4  Bone Mineral Density (BMD): 0.903 g/cm2  Young Adult T-Score: -1.4  Z-Score: 0.3  LEFT FEMUR NECK  Bone Mineral Density (BMD): 0.536 g/cm2  Young Adult T-Score: -2.8  Z-Score: -1.4  ASSESSMENT: Patient's diagnostic category is OSTEOPOROSIS by WHO Criteria.  FRACTURE RISK: Increased  FRAX: Not assessed, some T-scores at or below -2.5.  COMPARISON: Prior exams, most recent dated 12/09/2012. Since the most recent prior study, there has been no statistically significant change in bone density in the lumbar spine or for the proximal left femur.  Effective therapies are available in the form of bisphosphonates, selective estrogen receptor modulators, biologic agents, and hormone replacement therapy (for women). All patients should ensure an adequate intake of dietary calcium (1200 mg daily) and vitamin D (800 IU daily) unless contraindicated.  All treatment decisions require clinical judgment and consideration of individual patient factors, including patient preferences, co-morbidities, previous drug use, risk factors not captured in the FRAX model (e.g., frailty, falls, vitamin D deficiency, increased bone turnover, interval significant decline in bone density) and possible under- or over-estimation of fracture risk by FRAX.  The National Osteoporosis Foundation recommends that FDA-approved medical therapies be considered in postmenopausal women and men age 20 or older with a:  1. Hip or vertebral (clinical or morphometric) fracture.  2. T-score of -2.5 or lower at the spine or hip.  3. Ten-year fracture probability by FRAX of 3% or greater for hip fracture or 20% or greater for major osteoporotic  fracture.  People with diagnosed cases of osteoporosis or at high risk for fracture should have regular bone mineral density tests. For patients eligible for Medicare, routine testing is allowed once every 2 years. The testing frequency can be increased to one year for patients who have rapidly progressing disease, those who are receiving or discontinuing medical therapy to restore bone mass, or have additional risk factors.  [place]  Organization (WHO) Criteria:  Normal: T-scores from +1.0 to -1.0  Low Bone Mass (Osteopenia): T-scores between -1.0 and -2.5  Osteoporosis: T-scores -2.5 and below  Comparison to Reference Population:  T-score is the key measure used in the diagnosis of osteoporosis and relative risk determination for fracture. It provides a value for bone mass relative to the mean bone mass of a young adult reference population expressed in terms of standard deviation (SD).  Z-score is the age-matched score showing the patient's values compared to a population matched for age, sex, and race. This is also expressed in terms of standard deviation. The patient may have values that compare favorably to the age-matched values and still be at increased risk for fracture.   Electronically Signed By: Lajean Manes M.D. On: 01/03/2015 11:59  Bone density scores stable. Continue with plan to start Reclast.

## 2014-12-23 ENCOUNTER — Telehealth: Payer: Self-pay | Admitting: *Deleted

## 2014-12-23 NOTE — Telephone Encounter (Signed)
Called pt and lvm advising her we have scheduled her DEXA for Friday, Aug 12th at 10:30, arrival time 10:10. No Calcium supplements for 48 hrs prior to the scan. Advised pt to call with any questions. Be advised.

## 2014-12-30 ENCOUNTER — Inpatient Hospital Stay: Admission: RE | Admit: 2014-12-30 | Payer: BC Managed Care – PPO | Source: Ambulatory Visit

## 2015-01-03 ENCOUNTER — Ambulatory Visit
Admission: RE | Admit: 2015-01-03 | Discharge: 2015-01-03 | Disposition: A | Discharge status: Home / Self Care | Payer: BC Managed Care – PPO | Source: Ambulatory Visit | Attending: Internal Medicine | Admitting: Internal Medicine

## 2015-01-06 ENCOUNTER — Telehealth: Payer: Self-pay | Admitting: *Deleted

## 2015-01-06 NOTE — Telephone Encounter (Signed)
Called pt and lvm advising her that her Reclast infusion is scheduled for

## 2015-01-10 ENCOUNTER — Encounter (HOSPITAL_COMMUNITY): Payer: Self-pay

## 2015-01-10 ENCOUNTER — Ambulatory Visit (HOSPITAL_COMMUNITY)
Admission: RE | Admit: 2015-01-10 | Discharge: 2015-01-10 | Disposition: A | Discharge status: Home / Self Care | Payer: BC Managed Care – PPO | Source: Ambulatory Visit | Attending: Internal Medicine | Admitting: Internal Medicine

## 2015-01-10 DIAGNOSIS — M810 Age-related osteoporosis without current pathological fracture: Secondary | ICD-10-CM | POA: Diagnosis present

## 2015-01-10 MED ORDER — ZOLEDRONIC ACID 5 MG/100ML IV SOLN
5.0000 mg | Freq: Once | INTRAVENOUS | Status: AC
  Administered 2015-01-10: 5 mg via INTRAVENOUS
  Filled 2015-01-10: qty 100

## 2015-01-10 MED ORDER — SODIUM CHLORIDE 0.9 % IV SOLN
INTRAVENOUS | Status: DC
  Administered 2015-01-10: 15:00:00 via INTRAVENOUS

## 2015-01-10 NOTE — Progress Notes (Signed)
Pt had first reclast infusion today.  Pt tolerated well.  D/c instructions regarding reclast reviewed and given to pt.  Informed pt to continue taking vitamin D and calcium unless otherwise directed by her doctor.  Pt voiced understanding.  Pt d/c home ambulatory to lobby.

## 2015-01-10 NOTE — Discharge Instructions (Signed)

## 2015-01-11 ENCOUNTER — Ambulatory Visit (INDEPENDENT_AMBULATORY_CARE_PROVIDER_SITE_OTHER): Payer: BC Managed Care – PPO | Admitting: Family Medicine

## 2015-01-11 ENCOUNTER — Other Ambulatory Visit: Payer: Self-pay | Admitting: Family Medicine

## 2015-01-11 VITALS — BP 108/62 | HR 78 | Temp 98.5°F | Resp 18 | Ht 66.0 in | Wt 115.0 lb

## 2015-01-11 DIAGNOSIS — Z7189 Other specified counseling: Secondary | ICD-10-CM

## 2015-01-11 DIAGNOSIS — Z7185 Encounter for immunization safety counseling: Secondary | ICD-10-CM

## 2015-01-11 DIAGNOSIS — F39 Unspecified mood [affective] disorder: Secondary | ICD-10-CM

## 2015-01-11 DIAGNOSIS — G4700 Insomnia, unspecified: Secondary | ICD-10-CM | POA: Diagnosis not present

## 2015-01-11 MED ORDER — CITALOPRAM HYDROBROMIDE 10 MG PO TABS: 10.0000 mg | ORAL_TABLET | Freq: Every day | ORAL | Status: DC

## 2015-01-11 MED ORDER — EPINEPHRINE 0.3 MG/0.3ML IJ SOAJ: INTRAMUSCULAR | Status: DC

## 2015-01-11 MED ORDER — DULOXETINE HCL 30 MG PO CPEP: 60.0000 mg | ORAL_CAPSULE | Freq: Every day | ORAL | Status: DC

## 2015-01-11 NOTE — Progress Notes (Signed)
   Subjective:    Patient ID: Amber Peck, female    DOB: 06-25-1950, 64 y.o.   MRN: [id number] This chart was scribed for Elvina Sidle, MD by Jolene Provost, Medical Scribe. This patient was seen in Room 12 and the patient's care was started a 2:10 PM.  Chief Complaint  Patient presents with  . Medication Refill    celexa and epi pen  . Insomnia    sleeps 3hrs a night x3 weeks     HPI HPI Comments: Amber Peck is a 64 y.o. female who presents to [facility] reporting for a medication refill.  She weaned herself off celexa, and since that time she has had increased difficulty sleeping. She recently retired and now she is sleeping even more poorly, roughly three hours per night. She has difficulty falling asleep, and when she takes melatonin she falls asleep but then wakes up within an hour.    Review of Systems  Constitutional: Negative for fever and chills.  Neurological: Negative for dizziness and headaches.  Psychiatric/Behavioral: Positive for sleep disturbance. The patient is not hyperactive.        Objective:   Physical Exam  Constitutional: She is oriented to person, place, and time. She appears well-developed and well-nourished. No distress.  HENT:  Head: Normocephalic and atraumatic.  Eyes: Pupils are equal, round, and reactive to light.  Neck: Neck supple.  Cardiovascular: Normal rate.   Pulmonary/Chest: Effort normal. No respiratory distress.  Musculoskeletal: Normal range of motion.  Neurological: She is alert and oriented to person, place, and time. Coordination normal.  Skin: Skin is warm and dry. She is not diaphoretic.  Psychiatric: She has a normal mood and affect. Her behavior is normal.  Nursing note and vitals reviewed.   Filed Vitals:   01/11/15 1403  BP: 108/62  Pulse: 78  Temp: 98.5 F (36.9 C)  TempSrc: Oral  Resp: 18  Height:  (1.676 m)  Weight: 115 lb (52.164 kg)  SpO2: 98%      Assessment & Plan:   This chart was scribed in my  presence and reviewed by me personally.    ICD-9-CM ICD-10-CM   1. Affective disorder 296.90 F39 citalopram (CELEXA) 10 MG tablet     DULoxetine (CYMBALTA) 30 MG capsule  2. Insomnia 780.52 G47.00 citalopram (CELEXA) 10 MG tablet  3. Immunization counseling V65.49 Z71.89 Flu Vaccine QUAD 36+ mos IM     Signed, Elvina Sidle, MD

## 2015-01-11 NOTE — Patient Instructions (Addendum)

## 2015-01-11 NOTE — Telephone Encounter (Signed)
Pt called and asked for RFs of EPI pen and citalopram. Discussed that citalopram was not on med list at last OV, but Cymbalta was. Pt reported that she has tried weaning off of both of these but feels that she needs something, and is also having trouble sleeping d/t menopause. Pt agreed that it is best she comes in to discuss these meds and her current Sxs since this is a rather complicated situation, and will come in today. Sent in RF of EPI pens.

## 2015-01-12 ENCOUNTER — Ambulatory Visit (HOSPITAL_COMMUNITY): Payer: BC Managed Care – PPO

## 2015-02-14 ENCOUNTER — Encounter: Payer: Self-pay | Admitting: Internal Medicine

## 2015-02-20 ENCOUNTER — Telehealth: Payer: Self-pay | Admitting: Family Medicine

## 2015-02-20 NOTE — Telephone Encounter (Signed)
Left a message for patient to return call about having a mammogram scheduled.

## 2015-04-18 ENCOUNTER — Other Ambulatory Visit: Payer: Self-pay

## 2015-04-18 DIAGNOSIS — Z1231 Encounter for screening mammogram for malignant neoplasm of breast: Secondary | ICD-10-CM

## 2015-05-19 ENCOUNTER — Ambulatory Visit: Payer: BC Managed Care – PPO

## 2015-06-08 ENCOUNTER — Ambulatory Visit (INDEPENDENT_AMBULATORY_CARE_PROVIDER_SITE_OTHER): Payer: BC Managed Care – PPO | Admitting: Emergency Medicine

## 2015-06-08 VITALS — BP 110/70 | HR 74 | Temp 98.2°F | Resp 16 | Ht 65.5 in | Wt 117.4 lb

## 2015-06-08 DIAGNOSIS — F329 Major depressive disorder, single episode, unspecified: Secondary | ICD-10-CM | POA: Diagnosis not present

## 2015-06-08 DIAGNOSIS — F32A Depression, unspecified: Secondary | ICD-10-CM

## 2015-06-08 DIAGNOSIS — F39 Unspecified mood [affective] disorder: Secondary | ICD-10-CM

## 2015-06-08 DIAGNOSIS — Z23 Encounter for immunization: Secondary | ICD-10-CM | POA: Diagnosis not present

## 2015-06-08 DIAGNOSIS — G4700 Insomnia, unspecified: Secondary | ICD-10-CM | POA: Diagnosis not present

## 2015-06-08 MED ORDER — VARICELLA VIRUS VACCINE LIVE 1350 PFU/0.5ML IJ SUSR: 0.5000 mL | Freq: Once | INTRAMUSCULAR | Status: DC

## 2015-06-08 MED ORDER — SCOPOLAMINE 1 MG/3DAYS TD PT72: 1.0000 | MEDICATED_PATCH | TRANSDERMAL | Status: DC

## 2015-06-08 MED ORDER — CITALOPRAM HYDROBROMIDE 10 MG PO TABS: 10.0000 mg | ORAL_TABLET | Freq: Every day | ORAL | Status: DC

## 2015-06-08 NOTE — Patient Instructions (Signed)
Major Depressive Disorder Major depressive disorder is a mental illness. It also may be called clinical depression or unipolar depression. Major depressive disorder usually causes feelings of sadness, hopelessness, or helplessness. Some people with this disorder do not feel particularly sad but lose interest in doing things they used to enjoy (anhedonia). Major depressive disorder also can cause physical symptoms. It can interfere with work, school, relationships, and other normal everyday activities. The disorder varies in severity but is longer lasting and more serious than the sadness we all feel from time to time in our lives. Major depressive disorder often is triggered by stressful life events or major life changes. Examples of these triggers include divorce, loss of your job or home, a move, and the death of a family member or close friend. Sometimes this disorder occurs for no obvious reason at all. People who have family members with major depressive disorder or bipolar disorder are at higher risk for developing this disorder, with or without life stressors. Major depressive disorder can occur at any age. It may occur just once in your life (single episode major depressive disorder). It may occur multiple times (recurrent major depressive disorder). SYMPTOMS People with major depressive disorder have either anhedonia or depressed mood on nearly a daily basis for at least 2 weeks or longer. Symptoms of depressed mood include:  Feelings of sadness (blue or down in the dumps) or emptiness.  Feelings of hopelessness or helplessness.  Tearfulness or episodes of crying (may be observed by others).  Irritability (children and adolescents). In addition to depressed mood or anhedonia or both, people with this disorder have at least four of the following symptoms:  Difficulty sleeping or sleeping too much.   Significant change (increase or decrease) in appetite or weight.   Lack of energy or  motivation.  Feelings of guilt and worthlessness.   Difficulty concentrating, remembering, or making decisions.  Unusually slow movement (psychomotor retardation) or restlessness (as observed by others).   Recurrent wishes for death, recurrent thoughts of self-harm (suicide), or a suicide attempt. People with major depressive disorder commonly have persistent negative thoughts about themselves, other people, and the world. People with severe major depressive disorder may experiencedistorted beliefs or perceptions about the world (psychotic delusions). They also may see or hear things that are not real (psychotic hallucinations). DIAGNOSIS Major depressive disorder is diagnosed through an assessment by your health care provider. Your health care provider will ask aboutaspects of your daily life, such as mood,sleep, and appetite, to see if you have the diagnostic symptoms of major depressive disorder. Your health care provider may ask about your medical history and use of alcohol or drugs, including prescription medicines. Your health care provider also may do a physical exam and blood work. This is because certain medical conditions and the use of certain substances can cause major depressive disorder-like symptoms (secondary depression). Your health care provider also may refer you to a mental health specialist for further evaluation and treatment. TREATMENT It is important to recognize the symptoms of major depressive disorder and seek treatment. The following treatments can be prescribed for this disorder:   Medicine. Antidepressant medicines usually are prescribed. Antidepressant medicines are thought to correct chemical imbalances in the brain that are commonly associated with major depressive disorder. Other types of medicine may be added if the symptoms do not respond to antidepressant medicines alone or if psychotic delusions or hallucinations occur.  Talk therapy. Talk therapy can be  helpful in treating major depressive disorder by providing   support, education, and guidance. Certain types of talk therapy also can help with negative thinking (cognitive behavioral therapy) and with relationship issues that trigger this disorder (interpersonal therapy). A mental health specialist can help determine which treatment is best for you. Most people with major depressive disorder do well with a combination of medicine and talk therapy. Treatments involving electrical stimulation of the brain can be used in situations with extremely severe symptoms or when medicine and talk therapy do not work over time. These treatments include electroconvulsive therapy, transcranial magnetic stimulation, and vagal nerve stimulation.   This information is not intended to replace advice given to you by your health care provider. Make sure you discuss any questions you have with your health care provider.   Document Released: 08/31/2012 Document Revised: 05/27/2014 Document Reviewed: 08/31/2012 Elsevier Interactive Patient Education 2016 [employer]  

## 2015-06-08 NOTE — Progress Notes (Signed)
Subjective:  Patient ID: Amber Peck, female    DOB: December 21, 1950  Age: 65 y.o. MRN: [id number]  CC: Medication Refill and Insomnia   HPI Amber Peck presents   She has long history of depression and insomnia and anxiety. Has been weaning herself off her medication and she can do fine without it. She is dropped down to 10 mg of  Celexa and is taking 30 mg of Cymbalta. She for some reason thinks that she is lesser person because she is taking medication once to be off of it he was noted her symptoms are well controlled on the medication she is having some difficulty with insomnia for which she takes melatonin at night she is retired and since retiring her symptoms improved and she is finding that she's not having any any adverse effect of sleeping only 45 hours a night is alert and active and appropriate during the day with no mood dysfunction  History Amber Peck has a past medical history of Cataract and Anxiety.   She has no past surgical history on file.   Her  family history includes Cancer in her brother, father, maternal grandmother, mother, and paternal grandfather; Heart disease in her maternal grandfather.  She   reports that she has never smoked. She does not have any smokeless tobacco history on file. She reports that she drinks alcohol. She reports that she does not use illicit drugs.  Outpatient Prescriptions Prior to Visit  Medication Sig Dispense Refill  . calcium carbonate (OS-CAL) 600 MG TABS tablet Take 600 mg by mouth daily.    . cholecalciferol (VITAMIN D) 1000 UNITS tablet Take 1,000 Units by mouth daily.    . DULoxetine (CYMBALTA) 30 MG capsule Take 2 capsules (60 mg total) by mouth daily. 90 capsule 3  . EPINEPHrine (EPIPEN 2-PAK) 0.3 mg/0.3 mL IJ SOAJ injection INJECT 0.3 MLS (0.3 MG TOTAL) INTO THE MUSCLE ONCE. 2 Device 0  . citalopram (CELEXA) 10 MG tablet Take 1 tablet (10 mg total) by mouth daily. 90 tablet 3   No facility-administered medications prior to visit.      Social History   Social History  . Marital Status: Single    Spouse Name: N/A  . Number of Children: N/A  . Years of Education: N/A   Social History Main Topics  . Smoking status: Never Smoker   . Smokeless tobacco: None  . Alcohol Use: Yes     Comment: 2-3 glasses of wine or 2-3 beers per week  . Drug Use: No  . Sexual Activity: Not Currently   Other Topics Concern  . None   Social History Narrative     Review of Systems  Constitutional: Negative for fever, chills and appetite change.  HENT: Negative for congestion, ear pain, postnasal drip, sinus pressure and sore throat.   Eyes: Negative for pain and redness.  Respiratory: Negative for cough, shortness of breath and wheezing.   Cardiovascular: Negative for leg swelling.  Gastrointestinal: Negative for nausea, vomiting, abdominal pain, diarrhea, constipation and blood in stool.  Endocrine: Negative for polyuria.  Genitourinary: Negative for dysuria, urgency, frequency and flank pain.  Musculoskeletal: Negative for gait problem.  Skin: Negative for rash.  Neurological: Negative for weakness and headaches.  Psychiatric/Behavioral: Positive for sleep disturbance. Negative for suicidal ideas, confusion and decreased concentration. The patient is not nervous/anxious.     Objective:  BP 110/70 mmHg  Pulse 74  Temp(Src) 98.2 F (36.8 C) (Oral)  Resp 16  Ht 5' 5.5" (  1.664 m)  Wt 117 lb 6.4 oz (53.252 kg)  BMI 19.23 kg/m2  SpO2 97%  Physical Exam  Constitutional: She is oriented to person, place, and time. She appears well-developed and well-nourished.  HENT:  Head: Normocephalic and atraumatic.  Eyes: Conjunctivae are normal. Pupils are equal, round, and reactive to light.  Pulmonary/Chest: Effort normal.  Musculoskeletal: She exhibits no edema.  Neurological: She is alert and oriented to person, place, and time.  Skin: Skin is dry.  Psychiatric: She has a normal mood and affect. Her behavior is normal.  Thought content normal.      Assessment & Plan:   Amber Peck was seen today for medication refill and insomnia.  Diagnoses and all orders for this visit:  Depression  Insomnia -     citalopram (CELEXA) 10 MG tablet; Take 1 tablet (10 mg total) by mouth daily.  Affective disorder (HCC) -     citalopram (CELEXA) 10 MG tablet; Take 1 tablet (10 mg total) by mouth daily.  Need for varicella vaccine  Need for prophylactic vaccination against Streptococcus pneumoniae (pneumococcus)  Other orders -     scopolamine (TRANSDERM-SCOP, 1.5 MG,) 1 MG/3DAYS; Place 1 patch (1.5 mg total) onto the skin every 3 (three) days. -     Pneumococcal polysaccharide vaccine 23-valent greater than or equal to 2yo subcutaneous/IM -     varicella virus vaccine live (VARIVAX) 1350 PFU/0.5ML INJ injection; Inject 0.5 mLs into the skin once.   I am having [name] start on scopolamine and varicella virus vaccine live. I am also having her maintain her calcium carbonate, cholecalciferol, DULoxetine, EPINEPHrine, and citalopram.  Meds ordered this encounter  Medications  . scopolamine (TRANSDERM-SCOP, 1.5 MG,) 1 MG/3DAYS    Sig: Place 1 patch (1.5 mg total) onto the skin every 3 (three) days.    Dispense:  10 patch    Refill:  12  . citalopram (CELEXA) 10 MG tablet    Sig: Take 1 tablet (10 mg total) by mouth daily.    Dispense:  90 tablet    Refill:  3  . varicella virus vaccine live (VARIVAX) 1350 PFU/0.5ML INJ injection    Sig: Inject 0.5 mLs into the skin once.    Dispense:  1 each    Refill:  0    I'm I encouraged her to remain on the medication if she is got good symptom control and not being rushed off medicine if she's doing well  Appropriate red flag conditions were discussed with the patient as well as actions that should be taken.  Patient expressed his understanding.  Follow-up: Return if symptoms worsen or fail to improve.  Carmelina Dane, MD

## 2015-08-30 ENCOUNTER — Telehealth: Payer: Self-pay

## 2015-08-30 MED ORDER — VALACYCLOVIR HCL 1 G PO TABS: 1000.0000 mg | ORAL_TABLET | Freq: Two times a day (BID) | ORAL | Status: DC

## 2015-08-30 NOTE — Telephone Encounter (Signed)
Can we send in? I do not see that we ever Rx's this for pt.

## 2015-08-30 NOTE — Telephone Encounter (Signed)
Pt.notified

## 2015-08-30 NOTE — Telephone Encounter (Signed)
PATIENT CALLED HER PHARMACY TO GET A REFILL ON VALACYCLOVIR HCL 500 MG, BUT THE PHARMACIST TOLD HER SHE HAS ALREADY USED ALL 10 PILLS. SHE HAS A FEVER BLISTER ON HER LIP THAT HURTS AND SHE WOULD REALLY LIKE TO GET IT REFILLED. BEST PHONE [phone] (HOME)  PH[facility]ACY CHOICE IS [facility] [address] ([place])  MBC

## 2015-09-25 ENCOUNTER — Ambulatory Visit: Payer: BC Managed Care – PPO

## 2015-09-26 ENCOUNTER — Ambulatory Visit: Payer: BC Managed Care – PPO

## 2015-10-10 ENCOUNTER — Other Ambulatory Visit: Payer: Self-pay | Admitting: Family Medicine

## 2015-12-18 ENCOUNTER — Ambulatory Visit (INDEPENDENT_AMBULATORY_CARE_PROVIDER_SITE_OTHER): Payer: BC Managed Care – PPO | Admitting: Internal Medicine

## 2015-12-18 ENCOUNTER — Encounter: Payer: Self-pay | Admitting: Internal Medicine

## 2015-12-18 VITALS — BP 108/72 | HR 74 | Ht 66.0 in | Wt 115.0 lb

## 2015-12-18 DIAGNOSIS — M810 Age-related osteoporosis without current pathological fracture: Secondary | ICD-10-CM | POA: Diagnosis not present

## 2015-12-18 NOTE — Patient Instructions (Signed)
Please stop at the lab.  We will need a new DEXA scan in 1 year.  Please return in 1 year.

## 2015-12-18 NOTE — Progress Notes (Signed)
Patient ID: Amber Peck, female   DOB: 10/04/1950, 65 y.o.   MRN: [id number]   HPI  Amber Peck is a 65 y.o.-year-old female, referred by her PCP, Dr. Joseph Art, for management of osteoporosis.  Pt was dx with OP in 2014. She denies recent fractures or falls. Had an elbow fx (traumatic - rollerskating) at 65 y/o.  No dizziness/vertigo/orthostasis.  I reviewed pt's DEXA scans: Date L1-L4 T score FN T score  01/03/2015 L1, L3, L4: -1.4 LFN: -2.8  12/09/2012 ([facility]) - 1.2 (-9.5%*) LFN: - 2.9 RFN: n/a  06/30/2002 - 0.3 LFN: - 2.2   We started Reclast at last visit. She had one injection >> no SEs.  No h/o vitamin D deficiency. Last vit D level was: Lab Results  Component Value Date   VD25OH 50.55 12/21/2014   Previously: 108 in 01/08/2014.  Pt is on calcium citrate and vitamin D 1-2 a day. She also eats dairy and green, leafy, vegetables.   She was running before, not anymore. She walks 45 min-1h a day with her dog. Also does yoga and lifts weights She spends a lot of time at the beach.   She does not take high vitamin A doses.  No h/o hyper/hypocalcemia. No h/o hyperparathyroidism. No h/o kidney stones. Lab Results  Component Value Date   PTH 55.0 06/15/2013   CALCIUM 8.8 12/21/2014   CALCIUM 9.0 11/04/2014   CALCIUM 9.4 09/28/2014   CALCIUM 8.6 01/08/2014   CALCIUM 9.3 06/15/2013   CALCIUM 9.2 11/08/2012   No h/o thyrotoxicosis. Reviewed TSH recent levels:  Lab Results  Component Value Date   TSH 1.035 11/04/2014   TSH 3.177 09/28/2014   TSH 1.433 01/08/2014   TSH 1.678 06/15/2013   No h/o CKD. Last BUN/Cr: Lab Results  Component Value Date   BUN 17 12/21/2014   CREATININE 0.68 12/21/2014   Menopause was in her 63s.   Pt does have a FH of osteoporosis >> mother: hip fx >> on Fosamax, then iv Meds >> improved.  ROS: Constitutional: no weight gain/loss, no fatigue, no subjective hyperthermia/hypothermia Eyes: no blurry vision, no xerophthalmia ENT:  no sore throat, no nodules palpated in throat, no dysphagia/odynophagia, no hoarseness Cardiovascular: no CP/SOB/palpitations/leg swelling Respiratory: no cough/SOB Gastrointestinal: no N/V/D/C Musculoskeletal: no muscle/+ joint aches (hip) Skin: no rashes, no hair loss Neurological: no tremors/numbness/tingling/dizziness  I reviewed pt's medications, allergies, PMH, social hx, family hx, and changes were documented in the history of present illness. Otherwise, unchanged from my initial visit note. Stopped Culoxetine.  Past Medical History:  Diagnosis Date  . Anxiety   . Cataract    No past surgical history on file.   History   Social History  . Marital Status: Single    Spouse Name: N/A  . Number of Children: 0   Occupational History  . Retired Engineer, mining   Social History Main Topics  . Smoking status: Never Smoker   . Smokeless tobacco: Not on file  . Alcohol Use: Yes     Comment: 1 glass of wine 2-3x a mo  . Drug Use: No   Current Outpatient Prescriptions on File Prior to Visit  Medication Sig Dispense Refill  . calcium carbonate (OS-CAL) 600 MG TABS tablet Take 600 mg by mouth daily.    . cholecalciferol (VITAMIN D) 1000 UNITS tablet Take 1,000 Units by mouth daily.    . citalopram (CELEXA) 10 MG tablet Take 1 tablet (10 mg total) by mouth daily. 90 tablet 3  .  DULoxetine (CYMBALTA) 30 MG capsule Take 2 capsules (60 mg total) by mouth daily. 90 capsule 3  . DULoxetine (CYMBALTA) 30 MG capsule TAKE 2 CAPSULES (60 MG TOTAL) BY MOUTH DAILY. 180 capsule 0  . EPINEPHrine (EPIPEN 2-PAK) 0.3 mg/0.3 mL IJ SOAJ injection INJECT 0.3 MLS (0.3 MG TOTAL) INTO THE MUSCLE ONCE. 2 Device 0  . scopolamine (TRANSDERM-SCOP, 1.5 MG,) 1 MG/3DAYS Place 1 patch (1.5 mg total) onto the skin every 3 (three) days. 10 patch 12  . valACYclovir (VALTREX) 1000 MG tablet Take 1 tablet (1,000 mg total) by mouth 2 (two) times daily. 20 tablet 0  . varicella virus vaccine live (VARIVAX) 1350 PFU/0.5ML  INJ injection Inject 0.5 mLs into the skin once. 1 each 0  . [DISCONTINUED] albuterol (PROVENTIL HFA;VENTOLIN HFA) 108 (90 BASE) MCG/ACT inhaler Inhale 1-2 puffs into the lungs every 6 (six) hours as needed for wheezing or shortness of breath. 1 Inhaler 0   No current facility-administered medications on file prior to visit.    Allergies  Allergen Reactions  . Bee Venom    Family History  Problem Relation Age of Onset  . Cancer Mother     breast  . Cancer Father   . Cancer Brother   . Cancer Maternal Grandmother     breast  . Heart disease Maternal Grandfather   . Cancer Paternal Grandfather     lung  Also: - HTN in brother, father, GM - HL in mother, GM - heart ds - GM  PE: BP 108/72 (BP Location: Left Arm, Patient Position: Sitting)   Pulse 74   Ht '5\' 6"'  (1.676 m)   Wt 115 lb (52.2 kg)   BMI 18.56 kg/m  Wt Readings from Last 3 Encounters:  12/18/15 115 lb (52.2 kg)  06/08/15 117 lb 6.4 oz (53.3 kg)  01/11/15 115 lb (52.2 kg)   Constitutional: normal weight, in NAD. No kyphosis. Eyes: PERRLA, EOMI, no exophthalmos ENT: moist mucous membranes, no thyromegaly, no cervical lymphadenopathy Cardiovascular: RRR, No MRG Respiratory: CTA B Gastrointestinal: abdomen soft, NT, ND, BS+ Musculoskeletal: no deformities, strength intact in all 4 Skin: moist, warm, no rashes Neurological: no tremor with outstretched hands, DTR normal in all 4  Assessment: 1. Osteoporosis  Plan: 1. Osteoporosis - likely postmenopausal  - We reviewed together her previous DEXA scan from 2016, which is stable - No falls or fractures since last year - We started Reclast after last visit and she has had one dose - No side effects from Reclast - We'll continue with Reclast - plan to continue for 5 years - check BMP and vitamin D today - continue vit D supplementation - she is skipping doses >> advised to take consistently - will check a new DEXA scan since she is 2 years after the previous,  which would be next June - after results are back, will arrange for a Reclast infusion - will see pt back in a year  No visits with results within 1 Day(s) from this visit.  Latest known visit with results is:  Office Visit on 12/21/2014  Component Date Value Ref Range Status  . VITD 12/21/2014 50.55  30.00 - 100.00 ng/mL Final  . Sodium 12/21/2014 140  135 - 146 mmol/L Final  . Potassium 12/21/2014 4.5  3.5 - 5.3 mmol/L Final  . Chloride 12/21/2014 104  98 - 110 mmol/L Final  . CO2 12/21/2014 23  20 - 31 mmol/L Final  . Glucose, Bld 12/21/2014 88  65 - 99  mg/dL Final  . BUN 12/21/2014 17  7 - 25 mg/dL Final  . Creat 12/21/2014 0.68  0.50 - 0.99 mg/dL Final  . Calcium 12/21/2014 8.8  8.6 - 10.4 mg/dL Final  . GFR, Est African American 12/21/2014 >89  >=60 mL/min Final  . GFR, Est Non African American 12/21/2014 >89  >=60 mL/min Final   Comment:   The estimated GFR is a calculation valid for adults (>=66 years old) that uses the CKD-EPI algorithm to adjust for age and sex. It is   not to be used for children, pregnant women, hospitalized patients,    patients on dialysis, or with rapidly changing kidney function. According to the NKDEP, eGFR >89 is normal, 60-89 shows mild impairment, 30-59 shows moderate impairment, 15-29 shows severe impairment and <15 is ESRD.     Footnotes:  (1) ** Please note change in unit of measure and reference range(s). **      Normal labs.

## 2015-12-21 ENCOUNTER — Ambulatory Visit: Payer: BC Managed Care – PPO | Admitting: Internal Medicine

## 2016-01-15 ENCOUNTER — Telehealth: Payer: Self-pay

## 2016-01-15 NOTE — Telephone Encounter (Signed)
Patient has a fever blister and needs a refill for valacyclovir as soon as possible. She's completely out. [phone]  [facility]

## 2016-01-16 MED ORDER — VALACYCLOVIR HCL 1 G PO TABS: 1000.0000 mg | ORAL_TABLET | Freq: Two times a day (BID) | ORAL | 0 refills | Status: AC

## 2016-01-16 NOTE — Telephone Encounter (Signed)
Sent in 1 RF but pt due for f/up for med refills. LMOM for pt to advise of this and that she should call first for same day appt instead of walk in.

## 2016-01-31 ENCOUNTER — Telehealth: Payer: Self-pay | Admitting: Nutrition

## 2016-01-31 NOTE — Telephone Encounter (Signed)
Message left on her machine that we need to schedule an appt. For reclast infusion.

## 2016-02-14 ENCOUNTER — Telehealth: Payer: Self-pay | Admitting: Nutrition

## 2016-02-14 NOTE — Telephone Encounter (Signed)
Message left on machine to call the office for us to schedule her for Reclast through the hospital, since I will be gone for 1 month.

## 2016-02-16 ENCOUNTER — Telehealth: Payer: Self-pay | Admitting: Internal Medicine

## 2016-02-16 NOTE — Telephone Encounter (Signed)
Pt is calling about reclast to be done at the hospital in Linda's absence, please advise

## 2016-02-22 NOTE — Telephone Encounter (Signed)
Yes, let's do this as soon as possible. She has to orders in, one for vitamin D and 1 for BMP.

## 2016-02-23 ENCOUNTER — Telehealth: Payer: Self-pay

## 2016-02-23 NOTE — Telephone Encounter (Signed)
Called and left message for patient, advising of the reclast. Advised we needed lab results completed a vitamin D and a BMP before the reclast can be completed. Patient advised that labs are good for 30 days, so reclast needs to be done during that time. Gave call back number to schedule labs and if any questions.

## 2016-02-27 ENCOUNTER — Other Ambulatory Visit (INDEPENDENT_AMBULATORY_CARE_PROVIDER_SITE_OTHER): Payer: BC Managed Care – PPO

## 2016-02-27 ENCOUNTER — Other Ambulatory Visit: Payer: Self-pay | Admitting: Internal Medicine

## 2016-02-27 ENCOUNTER — Telehealth: Payer: Self-pay | Admitting: Internal Medicine

## 2016-02-27 DIAGNOSIS — M810 Age-related osteoporosis without current pathological fracture: Secondary | ICD-10-CM

## 2016-02-27 LAB — BASIC METABOLIC PANEL WITH GFR
BUN: 20 mg/dL (ref 7–25)
CALCIUM: 8.9 mg/dL (ref 8.6–10.4)
CO2: 29 mmol/L (ref 20–31)
CREATININE: 0.83 mg/dL (ref 0.50–0.99)
Chloride: 104 mmol/L (ref 98–110)
GFR, EST AFRICAN AMERICAN: 86 mL/min (ref >=60)
GFR, EST NON AFRICAN AMERICAN: 75 mL/min (ref >=60)
Glucose, Bld: 90 mg/dL (ref 65–99)
Potassium: 4.7 mmol/L (ref 3.5–5.3)
SODIUM: 140 mmol/L (ref 135–146)

## 2016-02-27 LAB — VITAMIN D 25 HYDROXY (VIT D DEFICIENCY, FRACTURES): VITD: 36.65 ng/mL (ref 30.00–100.00)

## 2016-02-27 NOTE — Telephone Encounter (Signed)
OK, no pb! C 

## 2016-02-27 NOTE — Telephone Encounter (Signed)
Pt stopped at the front desk to let you know that she is just going to wait on Bonita QuinLinda to return to have the reclast she is opting out of doing it at short stay

## 2016-02-28 ENCOUNTER — Telehealth: Payer: Self-pay

## 2016-02-28 NOTE — Telephone Encounter (Signed)
Called and left message advising of normal lab results. Gave all back number if any questions.

## 2016-03-07 ENCOUNTER — Ambulatory Visit (INDEPENDENT_AMBULATORY_CARE_PROVIDER_SITE_OTHER): Payer: BC Managed Care – PPO

## 2016-03-07 ENCOUNTER — Ambulatory Visit (INDEPENDENT_AMBULATORY_CARE_PROVIDER_SITE_OTHER): Payer: BC Managed Care – PPO | Admitting: Family Medicine

## 2016-03-07 VITALS — BP 100/66 | HR 84 | Temp 98.4°F | Resp 16 | Ht 66.0 in | Wt 115.6 lb

## 2016-03-07 DIAGNOSIS — M25522 Pain in left elbow: Secondary | ICD-10-CM

## 2016-03-07 DIAGNOSIS — M79632 Pain in left forearm: Secondary | ICD-10-CM | POA: Diagnosis not present

## 2016-03-07 NOTE — Patient Instructions (Addendum)
  It was good to see you today.  You do not have any fractures in her arm. This is good news.  Wear the sling for relief through the weekend. Make sure you are working to increase motion back into both the wrist and the elbow. Do this multiple times a day.  If you're going out in public wear the sling for the next several days for support and keep people from ENT.  If you're still having issues coming Monday come back and see us.  He continues ibuprofen 200 mg 2 pills every 6-8 hours as needed for pain and inflammation.  IF you received an x-ray today, you will receive an invoice from [facility]. Please contact [facility] at [phone] with questions or concerns regarding your invoice.   IF you received labwork today, you will receive an invoice from [facility]. Please contact [facility] at [phone] with questions or concerns regarding your invoice.   Our billing staff will not be able to assist you with questions regarding bills from these companies.  You will be contacted with the lab results as soon as they are available. The fastest way to get your results is to activate your My Chart account. Instructions are located on the last page of this paperwork. If you have not heard from us regarding the results in 2 weeks, please contact this office.

## 2016-03-07 NOTE — Progress Notes (Signed)
Amber Peck is a 65 y.o. female who presents to [facility] today for left arm pain:  1.  Left arm and elbow pain: Patient states that about 2 hours ago she is walking out of her bathroom and tripped over an open drawer at foot height. She fell and landed on her left side. Landed mostly on her left forearm and elbow. Had fairly immediate pain. She also landed on her left knee and right wrist but these do not hurt her.  Pain is continued and due to the degree of pain she presented today here. She has not noted any bruising or swelling. She had a fracture several years ago after falling while rollerskating. She is unable to remember whether that pain resembled this pain at all.  ROS as above.    PMH reviewed. Patient is a nonsmoker.   Past Medical History:  Diagnosis Date  . Anxiety   . Cataract    No past surgical history on file.  Medications reviewed. Current Outpatient Prescriptions  Medication Sig Dispense Refill  . calcium carbonate (OS-CAL) 600 MG TABS tablet Take 600 mg by mouth daily.    . cholecalciferol (VITAMIN D) 1000 UNITS tablet Take 1,000 Units by mouth daily.    . citalopram (CELEXA) 10 MG tablet Take 1 tablet (10 mg total) by mouth daily. 90 tablet 3  . DULoxetine (CYMBALTA) 30 MG capsule Take 2 capsules (60 mg total) by mouth daily. 90 capsule 3  . EPINEPHrine (EPIPEN 2-PAK) 0.3 mg/0.3 mL IJ SOAJ injection INJECT 0.3 MLS (0.3 MG TOTAL) INTO THE MUSCLE ONCE. 2 Device 0  . scopolamine (TRANSDERM-SCOP, 1.5 MG,) 1 MG/3DAYS Place 1 patch (1.5 mg total) onto the skin every 3 (three) days. 10 patch 12  . valACYclovir (VALTREX) 1000 MG tablet Take 1 tablet (1,000 mg total) by mouth 2 (two) times daily. 20 tablet 0  . DULoxetine (CYMBALTA) 30 MG capsule TAKE 2 CAPSULES (60 MG TOTAL) BY MOUTH DAILY. (Patient not taking: Reported on 03/07/2016) 180 capsule 0  . varicella virus vaccine live (VARIVAX) 1350 PFU/0.5ML INJ injection Inject 0.5 mLs into the skin  once. (Patient not taking: Reported on 03/07/2016) 1 each 0   No current facility-administered medications for this visit.      Physical Exam:  BP 100/66 (BP Location: Right Arm, Patient Position: Sitting, Cuff Size: Normal)   Pulse 84   Temp 98.4 F (36.9 C) (Oral)   Resp 16   Ht 5\' 6"  (1.676 m)   Wt 115 lb 9.6 oz (52.4 kg)   SpO2 98%   BMI 18.66 kg/m  Gen:  Alert, cooperative patient who appears stated age in no acute distress.  Vital signs reviewed. MSK:  Within normal limits Left arm: Tenderness with palpation just superior to left olecranon no bruising noted here. No tenderness directly over olecranon. No tenderness or bruising noted medial or lateral epicondyle. She does have some minor pain along the length of the radius and ulnar bone. She is able to resist pronation and supination of her arm but this does reproduce some of her pain. She is also able to resist flexion and extension of her elbow but also does have some pain. She has no bruising over her forearm. No edema noted here either. She is neurovascularly intact distal left hand.   Assessment and Plan:  1.  Left forearm pain: - secondary to direct fall.  No evidence of fracture.  No bruising.  Secondary to direct trauma to  arm.  - She is able to resist hand, forearm, arm strength testing without much pain. However at rest she still feels that she needs to support her arm and sits with her arm in internal rotation in abducted across her body. She will be going out into public with her friends several times this weekend. We will provide her with a sling to wear mostly for support. Ibuprofen as needed for pain relief and inflammation. She should be doing better by Sunday or Monday. At that point if she still having issues in the next week she should come back and see us and possibly that time consider repeat x-rays for any occult fracture.

## 2016-03-20 ENCOUNTER — Ambulatory Visit (INDEPENDENT_AMBULATORY_CARE_PROVIDER_SITE_OTHER): Payer: BC Managed Care – PPO | Admitting: Family Medicine

## 2016-03-20 VITALS — BP 126/80 | HR 74 | Temp 97.6°F | Resp 17 | Ht 66.0 in | Wt 116.0 lb

## 2016-03-20 DIAGNOSIS — M79602 Pain in left arm: Secondary | ICD-10-CM

## 2016-03-20 MED ORDER — SCOPOLAMINE 1 MG/3DAYS TD PT72: 1.0000 | MEDICATED_PATCH | TRANSDERMAL | 12 refills | Status: DC | PRN

## 2016-03-20 NOTE — Progress Notes (Signed)
   Amber Peck is a 65 y.o. female who presents to [facility] today for left arm pain:  1.  Left arm pain:  Patient seen here several weeks ago s/p fall.  Landed on Left arm.  Radiographs at that visit were negative.  Still with some mild, nagging pain since then.  Only time she has pain is when she twists something with her hand.  No pain with bending elbows, bending wrist.  She has been limiting her activity to ensure she's not hurting it worse.  Is doing some mild arm movements and execises, but nothing regular.   No paresthesias.  No weakness.     ROS as above.    PMH reviewed. Patient is a nonsmoker.   Past Medical History:  Diagnosis Date  . Anxiety   . Cataract    No past surgical history on file.  Medications reviewed.    Physical Exam:  BP 126/80 (BP Location: Right Arm, Patient Position: Sitting, Cuff Size: Normal)   Pulse 74   Temp 97.6 F (36.4 C) (Oral)   Resp 17   Ht 5\' 6"  (1.676 m)   Wt 116 lb (52.6 kg)   SpO2 100%   BMI 18.72 kg/m  Gen:  Alert, cooperative patient who appears stated age in no acute distress.  Vital signs reviewed. MSK:  No pain in left shoulder.  Able to fully abduct and adduct her arm.  Nontender Left elbow.  Able to fully flex and extend her elbow.  No effusion or redness.  Strength in hand is 5/5 handgrip.  Able to pronate and supinate fully, also able to do this against opposition though with some minor pain along forearm, in no specific distribution.  No syndesmotic pain.  Nontender directly over snuffbox.  nontender along radius and ulna  Assessment and Plan:  1.  Left forearm pain: - unclear etiology of pain.  Not in any particular area or distribution.  - Very difficult to reproduce pain here in clinic.  Nontender throughout entire arm.  Do not see need to re-image arm.   - Due to persistence of symptoms, plan referral to [facility] - She is to continue doing hand and arm exercises to keep full range of motion.

## 2016-03-20 NOTE — Patient Instructions (Addendum)
Keep movement in your arm.    I will refer you to Orthopedics today and they'll call you with an appointment.  It was very good to see you again today!     IF you received an x-ray today, you will receive an invoice from [facility]. Please contact [facility] at [phone] with questions or concerns regarding your invoice.   IF you received labwork today, you will receive an invoice from [facility]. Please contact [facility] at [phone] with questions or concerns regarding your invoice.   Our billing staff will not be able to assist you with questions regarding bills from these companies.  You will be contacted with the lab results as soon as they are available. The fastest way to get your results is to activate your My Chart account. Instructions are located on the last page of this paperwork. If you have not heard from us regarding the results in 2 weeks, please contact this office.     We recommend that you schedule a mammogram for breast cancer screening. Typically, you do not need a referral to do this. Please contact a local imaging center to schedule your mammogram.  [facility] - [phone]  *ask for the Radiology Department [facility] - [phone] or [phone]  [facility] - [phone] [facility] - [phone] [facility] - [phone]  *ask for the Radiology Department [facility] - [phone]  *ask for the Radiology Department [facility] - [phone]) [phone]  *ask for the Mammography Department [facility] - [phone]

## 2016-03-26 ENCOUNTER — Ambulatory Visit (INDEPENDENT_AMBULATORY_CARE_PROVIDER_SITE_OTHER): Payer: BC Managed Care – PPO | Admitting: Internal Medicine

## 2016-03-26 DIAGNOSIS — M8000XD Age-related osteoporosis with current pathological fracture, unspecified site, subsequent encounter for fracture with routine healing: Secondary | ICD-10-CM

## 2016-03-26 DIAGNOSIS — M810 Age-related osteoporosis without current pathological fracture: Secondary | ICD-10-CM | POA: Diagnosis not present

## 2016-03-26 DIAGNOSIS — M8000XS Age-related osteoporosis with current pathological fracture, unspecified site, sequela: Secondary | ICD-10-CM

## 2016-04-03 ENCOUNTER — Telehealth: Payer: Self-pay | Admitting: Family Medicine

## 2016-04-03 NOTE — Telephone Encounter (Signed)
Pt calling stating that she went to pharmacy to pick up a RX that [facility] and that she hasn't been on it for a long time and dint discuses anything about being nauseated please take Scopolamine Base Patch off her list of meds shes upset that she had to pay for something that she didn't need or discuss

## 2016-04-21 NOTE — Progress Notes (Signed)
Per Dr. Charlean SanfilippoGherghe's note on 12/18/15, and after the patient signed the consent, an IV was inserted into the patient's right antecubital space with a 18G needle.  Some Normal saline was infused at first to determine if the needle was correctly placed, and then 5mg . Of Reclast was started at 3:30PM and infused until 3:55PM.  Pt. Reported no discomfort during the infusion.  The IV was flushed with Normal Saline, and the D/C.  The site showed no signs of redness, or infiltration.   She was encouraged to drink plenty of fluids today, and to continue to take her calcium as directed. She had no final questions.  Reviewed. Carlus Pavlovristina Gherghe, MD PhD [facility]

## 2016-04-21 NOTE — Patient Instructions (Signed)
Continue to drink fluids- 8 ounces q 2 hours today Continue to take your calcium as directed

## 2016-06-17 ENCOUNTER — Other Ambulatory Visit: Payer: Self-pay

## 2016-06-17 DIAGNOSIS — F39 Unspecified mood [affective] disorder: Secondary | ICD-10-CM

## 2016-06-17 MED ORDER — CITALOPRAM HYDROBROMIDE 10 MG PO TABS: 10.0000 mg | ORAL_TABLET | Freq: Every day | ORAL | 0 refills | Status: DC

## 2016-06-17 NOTE — Telephone Encounter (Signed)
05/2015 last ov for depression needs visit

## 2016-07-21 ENCOUNTER — Other Ambulatory Visit: Payer: Self-pay | Admitting: Physician Assistant

## 2016-07-21 DIAGNOSIS — F39 Unspecified mood [affective] disorder: Secondary | ICD-10-CM

## 2016-07-22 NOTE — Telephone Encounter (Signed)
Please call and schedule a visit with her PCP within a month.

## 2016-07-23 ENCOUNTER — Other Ambulatory Visit: Payer: Self-pay | Admitting: Family Medicine

## 2016-08-21 ENCOUNTER — Encounter: Payer: Self-pay | Admitting: Internal Medicine

## 2016-08-27 ENCOUNTER — Other Ambulatory Visit: Payer: Self-pay | Admitting: Physician Assistant

## 2016-08-27 DIAGNOSIS — F39 Unspecified mood [affective] disorder: Secondary | ICD-10-CM

## 2016-12-17 ENCOUNTER — Ambulatory Visit (INDEPENDENT_AMBULATORY_CARE_PROVIDER_SITE_OTHER): Payer: Medicare Other | Admitting: Internal Medicine

## 2016-12-17 VITALS — BP 92/60 | HR 71 | Wt 111.0 lb

## 2016-12-17 DIAGNOSIS — M8000XD Age-related osteoporosis with current pathological fracture, unspecified site, subsequent encounter for fracture with routine healing: Secondary | ICD-10-CM

## 2016-12-17 LAB — VITAMIN D 25 HYDROXY (VIT D DEFICIENCY, FRACTURES): VITD: 36.15 ng/mL (ref 30.00–100.00)

## 2016-12-17 NOTE — Progress Notes (Signed)
Patient ID: Amber Peck, female   DOB: January 10, 1951, 66 y.o.   MRN: [id number]   HPI  Amber Phonne M Krinsky is a 66 y.o.-year-old female,   returning for follow-up for osteoporosis. Last visit a year ago.  Reviewed and addended history:  Pt was dx with OP in 2014. No recent fractures or falls. Had an elbow fx (traumatic - rollerskating) at 66 y/o.  No dizziness/vertigo/orthostasis.  I reviewed pt's DEXA scans: Date L1-L4 T score FN T score  01/03/2015 L1, L3, L4: -1.4 LFN: -2.8  [facility] - 1.2 (-9.5%*) LFN: - 2.9 RFN: n/a  06/30/2002 - 0.3 LFN: - 2.2   We started Reclast at And she had 2 doses, last 03/2016. No side effects.  No h/o vitamin D deficiency. Last vit D level was: Lab Results  Component Value Date   VD25OH 36.65 02/27/2016   Previously: 108 in 01/08/2014.  She continues on calcium citrate and vitamin D 0-1 times a day. She also eats dairy and green, leafy, vegetables.   She was running before,   now walking 45 min-1h a day with her dog. Also does yoga and lifts wshe spends a lot of time at the beach. Joined a hiking club.  No h/o hyper/hypocalcemia. No h/o hyperparathyroidism. No h/o kidney stones. Lab Results  Component Value Date   PTH 55.0 06/15/2013   CALCIUM 8.9 02/27/2016   CALCIUM 8.8 12/21/2014   CALCIUM 9.0 11/04/2014   CALCIUM 9.4 09/28/2014   CALCIUM 8.6 01/08/2014   CALCIUM 9.3 06/15/2013   CALCIUM 9.2 11/08/2012   No h/o thyrotoxicosis. Reviewed TSH recent levels:  Lab Results  Component Value Date   TSH 1.035 11/04/2014   TSH 3.177 09/28/2014   TSH 1.433 01/08/2014   TSH 1.678 06/15/2013   No h/o CKD. Last BUN/Cr: Lab Results  Component Value Date   BUN 20 02/27/2016   CREATININE 0.83 02/27/2016   Menopause was in her 50s.   Pt does have a FH of osteoporosis >> mother.  ROS: Constitutional: + weight loss, no fatigue, no subjective hyperthermia, no subjective hypothermia Eyes: no blurry vision, no xerophthalmia ENT: no sore  throat, no nodules palpated in throat, no dysphagia, no odynophagia, no hoarseness Cardiovascular: no CP/no SOB/no palpitations/no leg swelling Respiratory: no cough/no SOB/no wheezing Gastrointestinal: no N/no V/no D/no C/no acid reflux Musculoskeletal: no muscle aches/no joint aches Skin: no rashes, no hair loss Neurological: no tremors/no numbness/no tingling/no dizziness  I reviewed pt's medications, allergies, PMH, social hx, family hx, and changes were documented in the history of present illness. Otherwise, unchanged from my initial visit note.  Past Medical History:  Diagnosis Date  . Anxiety   . Cataract    No past surgical history on file.   History   Social History  . Marital Status: Single    Spouse Name: N/A  . Number of Children: 0   Occupational History  . Retired Animal nutritionist- artist   Social History Main Topics  . Smoking status: Never Smoker   . Smokeless tobacco: Not on file  . Alcohol Use: Yes     Comment: 1 glass of wine 2-3x a mo  . Drug Use: No   Current Outpatient Prescriptions on File Prior to Visit  Medication Sig Dispense Refill  . calcium carbonate (OS-CAL) 600 MG TABS tablet Take 600 mg by mouth daily.    . cholecalciferol (VITAMIN D) 1000 UNITS tablet Take 1,000 Units by mouth daily.    . citalopram (CELEXA) 10 MG tablet  TAKE 1 TABLET BY MOUTH EVERY DAY 30 tablet 0  . DULoxetine (CYMBALTA) 30 MG capsule Take 2 capsules (60 mg total) by mouth daily. 90 capsule 3  . DULoxetine (CYMBALTA) 30 MG capsule TAKE 2 CAPSULES (60 MG TOTAL) BY MOUTH DAILY. (Patient not taking: Reported on 03/20/2016) 180 capsule 0  . EPIPEN 2-PAK 0.3 MG/0.3ML SOAJ injection INJECT 0.3ML (0.3MG  TOTAL) INTO THE MUSCLE ONCE 1 Device 0  . scopolamine (TRANSDERM-SCOP, 1.5 MG,) 1 MG/3DAYS Place 1 patch (1.5 mg total) onto the skin as needed. For motion sickness 10 patch 12  . valACYclovir (VALTREX) 1000 MG tablet Take 1 tablet (1,000 mg total) by mouth 2 (two) times daily. 20 tablet 0  .  varicella virus vaccine live (VARIVAX) 1350 PFU/0.5ML INJ injection Inject 0.5 mLs into the skin once. (Patient not taking: Reported on 03/20/2016) 1 each 0  . [DISCONTINUED] albuterol (PROVENTIL HFA;VENTOLIN HFA) 108 (90 BASE) MCG/ACT inhaler Inhale 1-2 puffs into the lungs every 6 (six) hours as needed for wheezing or shortness of breath. 1 Inhaler 0   No current facility-administered medications on file prior to visit.    Allergies  Allergen Reactions  . Bee Venom    Family History  Problem Relation Age of Onset  . Cancer Mother        breast  . Cancer Father   . Cancer Brother   . Cancer Maternal Grandmother        breast  . Heart disease Maternal Grandfather   . Cancer Paternal Grandfather        lung  Also: - HTN in brother, father, GM - HL in mother, GM - heart ds - GM  PE: BP 92/60 (BP Location: Left Arm, Patient Position: Sitting)   Pulse 71   Wt 111 lb (50.3 kg)   SpO2 98%   BMI 17.92 kg/m  Wt Readings from Last 3 Encounters:  12/17/16 111 lb (50.3 kg)  03/20/16 116 lb (52.6 kg)  03/07/16 115 lb 9.6 oz (52.4 kg)   Constitutional: thin, in NAD, no kyphosis Eyes: PERRLA, EOMI, no exophthalmos ENT: moist mucous membranes, no thyromegaly, no cervical lymphadenopathy Cardiovascular: RRR, No MRG Respiratory: CTA B Gastrointestinal: abdomen soft, NT, ND, BS+ Musculoskeletal: no deformities, strength intact in all 4 Skin: moist, warm, no rashes Neurological: no tremor with outstretched hands, DTR normal in all 4  Assessment: 1. Osteoporosis  2. Decreasing vitamin D  Plan: 1. Osteoporosis - Most likely postmenopausal - No falls or fractures since last year-  - We reviewed together the reports of her previous DEXA scan from 2016, which is stable - She is due for another DEXA scan after 01/02/2017, at the breast center. Will order this today. - We started Reclast - she had 2 infusions, last 03/2016. No side effects. She has a crown done but no other dental work  in progress. No hip pain. - Plan to continue Reclast for at least 5 years  - Today we will check her BMP and vitamin D  - At last visit, she was skipping vitamin D doses, again discussed to take this consistently  - Will see the patient back in a year but I will be in touch with her regarding the DEXA scan.   2. Decreasing vitamin D: 108 >> 36 at last check - she forgets to take the daily vitamin D - will check the level today - She prefers once a week vit D if needed.  Office Visit on 12/17/2016  Component Date Value  Ref Range Status  . Sodium 12/17/2016 140  135 - 146 mmol/L Final  . Potassium 12/17/2016 4.0  3.5 - 5.3 mmol/L Final  . Chloride 12/17/2016 104  98 - 110 mmol/L Final  . CO2 12/17/2016 26  20 - 31 mmol/L Final  . Glucose, Bld 12/17/2016 83  65 - 99 mg/dL Final  . BUN 40/98/119107/31/2018 18  7 - 25 mg/dL Final  . Creat 47/82/956207/31/2018 0.70  0.50 - 0.99 mg/dL Final   Comment:   For patients > or = 65 years of age: The upper reference limit for Creatinine is approximately 13% higher for people identified as African-American.     . Calcium 12/17/2016 8.7  8.6 - 10.4 mg/dL Final  . GFR, Est African American 12/17/2016 >89  >=60 mL/min Final  . GFR, Est Non African American 12/17/2016 >89  >=60 mL/min Final  . VITD 12/17/2016 36.15  30.00 - 100.00 ng/mL Final   Normal labs.  Carlus Pavlovristina Adelyn Roscher, MD PhD [facility]

## 2016-12-17 NOTE — Patient Instructions (Signed)
Please stop at the lab.  We will need a new DEXA scan - will order.  Please return in 1 year.

## 2016-12-18 ENCOUNTER — Telehealth: Payer: Self-pay

## 2016-12-18 ENCOUNTER — Encounter: Payer: Self-pay | Admitting: Internal Medicine

## 2016-12-18 LAB — BASIC METABOLIC PANEL WITH GFR
BUN: 18 mg/dL (ref 7–25)
CHLORIDE: 104 mmol/L (ref 98–110)
CO2: 26 mmol/L (ref 20–31)
Calcium: 8.7 mg/dL (ref 8.6–10.4)
Creat: 0.7 mg/dL (ref 0.50–0.99)
GFR, Est African American: >89 mL/min (ref >=60)
GLUCOSE: 83 mg/dL (ref 65–99)
POTASSIUM: 4 mmol/L (ref 3.5–5.3)
Sodium: 140 mmol/L (ref 135–146)

## 2016-12-18 NOTE — Telephone Encounter (Signed)
Called patient and gave lab results. Patient had no questions or concerns.  

## 2016-12-18 NOTE — Telephone Encounter (Signed)
-----   Message from Carlus Pavlovristina Gherghe, MD sent at 12/18/2016  7:37 AM EDT ----- Raynelle FanningJulie, can you please call pt: Labs are normal.

## 2017-01-14 ENCOUNTER — Other Ambulatory Visit: Payer: Self-pay | Admitting: Urgent Care

## 2017-01-14 DIAGNOSIS — Z1231 Encounter for screening mammogram for malignant neoplasm of breast: Secondary | ICD-10-CM

## 2017-01-21 ENCOUNTER — Ambulatory Visit (INDEPENDENT_AMBULATORY_CARE_PROVIDER_SITE_OTHER): Payer: Medicare Other | Admitting: Internal Medicine

## 2017-01-21 DIAGNOSIS — M8000XD Age-related osteoporosis with current pathological fracture, unspecified site, subsequent encounter for fracture with routine healing: Secondary | ICD-10-CM

## 2017-01-24 ENCOUNTER — Ambulatory Visit
Admission: RE | Admit: 2017-01-24 | Discharge: 2017-01-24 | Disposition: A | Discharge status: Home / Self Care | Payer: Medicare Other | Source: Ambulatory Visit | Attending: Urgent Care | Admitting: Urgent Care

## 2017-01-24 DIAGNOSIS — Z1231 Encounter for screening mammogram for malignant neoplasm of breast: Secondary | ICD-10-CM

## 2017-03-11 NOTE — Patient Instructions (Signed)
Continue to take your calcium and Vitamen D as directed Drink at least 4 glasses of water today.

## 2017-03-11 NOTE — Progress Notes (Signed)
Per office note on 12/17/16, and after the patient signed the consent, an IV of Normal saline was started in right arm with a 21 g needle. Once the IV was determined to be patent, the Zolendronic acid, 5mg /16900ml was started at 09:10 AM: until 09:32 AM. The IV was flushed with normal saline and then removed. The site showed no signes of redness or swelling. She denied any symptoms of nausea, or light headedness while the IV was infusing, or afterwards. She was encouraged to drink plenty of fluids today, and to continue her calcium and vitamen D.  She agreed to do this and had no final questions.  Reviewed. Carlus Pavlovristina Gherghe MD

## 2017-03-31 ENCOUNTER — Ambulatory Visit (INDEPENDENT_AMBULATORY_CARE_PROVIDER_SITE_OTHER): Payer: Medicare Other | Admitting: Internal Medicine

## 2017-03-31 DIAGNOSIS — M8000XD Age-related osteoporosis with current pathological fracture, unspecified site, subsequent encounter for fracture with routine healing: Secondary | ICD-10-CM | POA: Diagnosis not present

## 2017-03-31 NOTE — Progress Notes (Signed)
Revised. Amber Peck Amber Peck Amber Basque MD

## 2017-03-31 NOTE — Progress Notes (Signed)
Per office note on 12/17/16, and after the patient signed the consent, an IV of Normal saline was started in left arm with 20 g needle. Once the IV was determined to be patent, the Zolendronic acid, 5mg /17900ml was started at 08 50AM: until 09:15 AM. The IV was flushed with normal saline and then removed. The site showed no signes of redness or swelling. She denied any symptoms of nausea, or light headedness while the IV was infusing, or afterwards. She was encouraged to drink plenty of fluids today, and to continue the calcium and vitamen D.  She agreed to do this and had no final questions.

## 2017-03-31 NOTE — Patient Instructions (Signed)
Drink plenty of fluids today Continue Calcium and Vit. D, per Dr. Charlean SanfilippoGherghe's instructions

## 2017-06-29 NOTE — Progress Notes (Signed)
I supervised the infusion. Estelene Carmack MD  

## 2017-11-27 ENCOUNTER — Ambulatory Visit: Payer: Medicare Other | Admitting: Physician Assistant

## 2017-11-27 ENCOUNTER — Encounter: Payer: Self-pay | Admitting: Physician Assistant

## 2017-11-27 ENCOUNTER — Other Ambulatory Visit: Payer: Self-pay

## 2017-11-27 VITALS — BP 120/60 | HR 73 | Temp 97.9°F | Ht 66.46 in | Wt 110.2 lb

## 2017-11-27 DIAGNOSIS — M810 Age-related osteoporosis without current pathological fracture: Secondary | ICD-10-CM | POA: Diagnosis not present

## 2017-11-27 DIAGNOSIS — Z7689 Persons encountering health services in other specified circumstances: Secondary | ICD-10-CM

## 2017-11-27 DIAGNOSIS — F419 Anxiety disorder, unspecified: Secondary | ICD-10-CM | POA: Diagnosis not present

## 2017-11-27 MED ORDER — DULOXETINE HCL 30 MG PO CPEP: 30.0000 mg | ORAL_CAPSULE | Freq: Every day | ORAL | 0 refills | Status: DC

## 2017-11-27 MED ORDER — ZOSTER VAC RECOMB ADJUVANTED 50 MCG/0.5ML IM SUSR: 0.5000 mL | Freq: Once | INTRAMUSCULAR | 0 refills | Status: AC

## 2017-11-27 NOTE — Patient Instructions (Signed)
It was a pleasure meeting you today. For anxiety, I recommend you restart daily duloxetine. We will start at low dose, 30mg  daily. Please keep in mind that when you start an SNRI it can worsen your depression and anxiety symptoms. It can also increase risk of suicidal thoughts so if this occurs, please seek care immediately. Common side effects of this medication include, but are not limited to, GI upset, nausea, vomiting, sweating, insomnia, and drowsiness. Typically these side effects will resolve after 2 weeks. Please keep in mind that it can take up to 4-6 weeks for SNRIs to be fully effective. .Plan to return to clinic in 2-4 weeks for reevaluation of symptoms and complete physical exam.

## 2017-11-27 NOTE — Progress Notes (Signed)
Amber Phonne M Botkin  MRN: [id number] DOB: June 19, 1950  Subjective:  Amber Peck is a 67 y.o. female seen in office today for a chief complaint of to establish care and discussion of restarting medications for anxiety.  Chronic medical conditions include osteoporosis, managed by Dr. Bertis RuddyGorsuch, and anxiety.  She has not been on medication for anxiety in about 2 years.  Has tried Celexa and duloxetine in the past.  Feels like duloxetine worked best for her.  She has been able to manage her anxiety over the past 2 years with yoga and meditation.  However, she is currently dealing with a very stressful situation with her neighbor and her anxiety is impacting her life.  She is working way too hard to control her thoughts and is having difficulty caring out her life tasks because of this.  Denies sleep disturbance, dysphoric mood, hallucinations, euphoria, suicidal or homicidal ideation.  No past medical history of seizure disorder.  Drinks wine occasionally.  Feels as if she has been drinking more want to deal with her stress lately.  Used to attend therapy for anxiety and depression and this worked really well for her.  Does not feel like she is at the point where she needs to go to therapy at this time.  Has someone she can contact if she feels like she needs to.  No other questions or concerns today.  Review of Systems  Per HPI  Patient Active Problem List   Diagnosis Date Noted  . Osteoporosis 12/22/2014    Current Outpatient Medications on File Prior to Visit  Medication Sig Dispense Refill  . calcium carbonate (OS-CAL) 600 MG TABS tablet Take 600 mg by mouth daily.    . cholecalciferol (VITAMIN D) 1000 UNITS tablet Take 1,000 Units by mouth daily.    . citalopram (CELEXA) 10 MG tablet TAKE 1 TABLET BY MOUTH EVERY DAY 30 tablet 0  . DULoxetine (CYMBALTA) 30 MG capsule Take 2 capsules (60 mg total) by mouth daily. 90 capsule 3  . DULoxetine (CYMBALTA) 30 MG capsule TAKE 2 CAPSULES (60 MG TOTAL) BY MOUTH  DAILY. (Patient not taking: Reported on 03/20/2016) 180 capsule 0  . EPIPEN 2-PAK 0.3 MG/0.3ML SOAJ injection INJECT 0.3ML (0.3MG  TOTAL) INTO THE MUSCLE ONCE 1 Device 0  . scopolamine (TRANSDERM-SCOP, 1.5 MG,) 1 MG/3DAYS Place 1 patch (1.5 mg total) onto the skin as needed. For motion sickness 10 patch 12  . valACYclovir (VALTREX) 1000 MG tablet Take 1 tablet (1,000 mg total) by mouth 2 (two) times daily. 20 tablet 0  . varicella virus vaccine live (VARIVAX) 1350 PFU/0.5ML INJ injection Inject 0.5 mLs into the skin once. (Patient not taking: Reported on 03/20/2016) 1 each 0  . [DISCONTINUED] albuterol (PROVENTIL HFA;VENTOLIN HFA) 108 (90 BASE) MCG/ACT inhaler Inhale 1-2 puffs into the lungs every 6 (six) hours as needed for wheezing or shortness of breath. 1 Inhaler 0   No current facility-administered medications on file prior to visit.     Allergies  Allergen Reactions  . Bee Venom      Objective:  BP 120/60 (BP Location: Left Arm, Patient Position: Sitting, Cuff Size: Normal)   Pulse 73   Temp 97.9 F (36.6 C) (Oral)   Ht 5' 6.46" (1.688 m)   Wt 110 lb 3.2 oz (50 kg)   SpO2 99%   BMI 17.54 kg/m   Physical Exam  Constitutional: She is oriented to person, place, and time. She appears well-developed and well-nourished. No distress.  HENT:  Head: Normocephalic and atraumatic.  Eyes: Conjunctivae are normal.  Neck: Normal range of motion.  Cardiovascular: Normal rate, regular rhythm, normal heart sounds and intact distal pulses.  Pulmonary/Chest: Effort normal.  Neurological: She is alert and oriented to person, place, and time.  Skin: Skin is warm and dry.  Psychiatric: She has a normal mood and affect.  Vitals reviewed.    GAD 7 : Generalized Anxiety Score 11/27/2017  Nervous, Anxious, on Edge 2  Control/stop worrying 2  Worry too much - different things 3  Trouble relaxing 2  Restless 3  Easily annoyed or irritable 1  Afraid - awful might happen 2  Total GAD 7 Score 15      Depression screen [facility] 2/9 11/27/2017 03/20/2016 03/07/2016 06/08/2015 01/11/2015  Decreased Interest 3 0 0 0 1  Down, Depressed, Hopeless 1 0 0 0 0  PHQ - 2 Score 4 0 0 0 1  Altered sleeping 3 - - - -  Tired, decreased energy 2 - - - -  Change in appetite 0 - - - -  Feeling bad or failure about yourself  1 - - - -  Trouble concentrating 2 - - - -  Moving slowly or fidgety/restless 0 - - - -  Suicidal thoughts 0 - - - -  PHQ-9 Score 12 - - - -     Assessment and Plan :  1. Anxiety Patient's anxiety is clearly uncontrolled at this time.  Recommend restarting duloxetine at low dose.  She has a therapist in mind if she needs counseling.  Plan to follow-up in 2 to 4 weeks for reevaluation of symptoms and CPE.  Will likely need to increase duloxetine dose at that time. - DULoxetine (CYMBALTA) 30 MG capsule; Take 1 capsule (30 mg total) by mouth daily.  Dispense: 90 capsule; Refill: 0  2. Encounter to establish care I am happy to take over patient's care.  Side effects, risks, benefits, and alternatives of the medications and treatment plan prescribed today were discussed, and patient expressed understanding of the instructions given. No barriers to understanding were identified. Red flags discussed in detail. Pt expressed understanding regarding what to do in case of emergency/urgent symptoms.   Benjiman Core PA-C  Primary Care at [facility] 11/27/2017 5:15 PM

## 2017-11-29 ENCOUNTER — Encounter: Payer: Self-pay | Admitting: Physician Assistant

## 2017-12-17 ENCOUNTER — Encounter: Payer: Self-pay | Admitting: Internal Medicine

## 2017-12-17 ENCOUNTER — Ambulatory Visit (INDEPENDENT_AMBULATORY_CARE_PROVIDER_SITE_OTHER): Payer: Medicare Other | Admitting: Internal Medicine

## 2017-12-17 ENCOUNTER — Other Ambulatory Visit: Payer: Self-pay | Admitting: Internal Medicine

## 2017-12-17 DIAGNOSIS — Z1231 Encounter for screening mammogram for malignant neoplasm of breast: Secondary | ICD-10-CM

## 2017-12-17 DIAGNOSIS — M810 Age-related osteoporosis without current pathological fracture: Secondary | ICD-10-CM | POA: Diagnosis not present

## 2017-12-17 LAB — VITAMIN D 25 HYDROXY (VIT D DEFICIENCY, FRACTURES): VITD: 31.7 ng/mL (ref 30.00–100.00)

## 2017-12-17 NOTE — Patient Instructions (Signed)
Please stop at the lab.  Please return in 1 year.  

## 2017-12-17 NOTE — Progress Notes (Signed)
Patient ID: Amber Peck, female   DOB: July 06, 1950, 67 y.o.   MRN: [id number]   HPI  Amber Peck is a 67 y.o.-year-old female,   returning for follow-up for osteoporosis. Last visit a year ago.  Reviewed and addended history:  Pt was dx with OP in 2014.  No fractures or falls recently. Had an elbow fx (traumatic - rollerskating) at 67 y/o.  No dizziness/vertigo/orthostasis.  Reviewed previous DXA scan reports: Date L1-L4 T score FN T score  01/03/2015 L1, L3, L4: -1.4 LFN: -2.8  [facility] - 1.2 (-9.5%*) LFN: - 2.9 RFN: n/a  06/30/2002 - 0.3 LFN: - 2.2   We started Reclast and she had 3 doses, last in 01/2017.  No side effects.    No history of vitamin D deficiency, however, last vitamin D level was much lower than before: Lab Results  Component Value Date   VD25OH 36.15 12/17/2016   Previously: 108 in 01/08/2014.  She continues on calcium citrate + Magnesium; and vitamin D ~1000 units.  At last visit, she was forgetting the vitamin D frequently.    She was running before, now going to the gym  - started 1 year ago.  No hyper/hypocalcemia.  No hyperparathyroidism.  No history of kidney stones Lab Results  Component Value Date   PTH 55.0 06/15/2013   CALCIUM 8.7 12/17/2016   CALCIUM 8.9 02/27/2016   CALCIUM 8.8 12/21/2014   CALCIUM 9.0 11/04/2014   CALCIUM 9.4 09/28/2014   CALCIUM 8.6 01/08/2014   CALCIUM 9.3 06/15/2013   CALCIUM 9.2 11/08/2012   No thyrotoxicosis. Reviewed reviewed her most recent TSH levels: Lab Results  Component Value Date   TSH 1.035 11/04/2014   TSH 3.177 09/28/2014   TSH 1.433 01/08/2014   TSH 1.678 06/15/2013   No CKD. Last BUN/Cr: Lab Results  Component Value Date   BUN 18 12/17/2016   CREATININE 0.70 12/17/2016   Menopause in her 50s.  She has a family history of osteoporosis in her mother.  ROS: Constitutional: no weight gain/+ weight loss (resolved), + fatigue, no subjective hyperthermia, no subjective  hypothermia Eyes: no blurry vision, no xerophthalmia ENT: no sore throat, no nodules palpated in throat, no dysphagia, no odynophagia, no hoarseness Cardiovascular: no CP/no SOB/no palpitations/no leg swelling Respiratory: no cough/no SOB/no wheezing Gastrointestinal: no N/no V/no D/no C/no acid reflux Musculoskeletal: no muscle aches/+ joint aches Skin: no rashes, no hair loss Neurological: no tremors/no numbness/no tingling/no dizziness  I reviewed pt's medications, allergies, PMH, social hx, family hx, and changes were documented in the history of present illness. Otherwise, unchanged from my initial visit note.  Past Medical History:  Diagnosis Date  . Anxiety   . Cataract    No past surgical history on file.   History   Social History  . Marital Status: Single    Spouse Name: N/A  . Number of Children: 0   Occupational History  . Retired Animal nutritionist   Social History Main Topics  . Smoking status: Never Smoker   . Smokeless tobacco: Not on file  . Alcohol Use: Yes     Comment: 1 glass of wine 2-3x a mo  . Drug Use: No   Current Outpatient Medications on File Prior to Visit  Medication Sig Dispense Refill  . calcium carbonate (OS-CAL) 600 MG TABS tablet Take 600 mg by mouth daily.    . cholecalciferol (VITAMIN D) 1000 UNITS tablet Take 1,000 Units by mouth daily.    . DULoxetine (  CYMBALTA) 30 MG capsule Take 1 capsule (30 mg total) by mouth daily. 90 capsule 0  . EPIPEN 2-PAK 0.3 MG/0.3ML SOAJ injection INJECT 0.3ML (0.3MG  TOTAL) INTO THE MUSCLE ONCE 1 Device 0  . valACYclovir (VALTREX) 1000 MG tablet Take 1 tablet (1,000 mg total) by mouth 2 (two) times daily. (Patient not taking: Reported on 12/17/2017) 20 tablet 0  . [DISCONTINUED] albuterol (PROVENTIL HFA;VENTOLIN HFA) 108 (90 BASE) MCG/ACT inhaler Inhale 1-2 puffs into the lungs every 6 (six) hours as needed for wheezing or shortness of breath. 1 Inhaler 0   No current facility-administered medications on file prior  to visit.    Allergies  Allergen Reactions  . Bee Venom    Family History  Problem Relation Age of Onset  . Cancer Mother        breast  . Cancer Father   . Cancer Brother   . Cancer Maternal Grandmother        breast  . Heart disease Maternal Grandfather   . Cancer Paternal Grandfather        lung  Also: - HTN in brother, father, GM - HL in mother, GM - heart ds - GM  PE: BP (!) 100/58   Pulse 82   Ht 5' 6.46" (1.688 m)   Wt 111 lb (50.3 kg)   SpO2 98%   BMI 17.67 kg/m  Wt Readings from Last 3 Encounters:  12/17/17 111 lb (50.3 kg)  11/27/17 110 lb 3.2 oz (50 kg)  12/17/16 111 lb (50.3 kg)   Constitutional: Thin, in NAD Eyes: PERRLA, EOMI, no exophthalmos ENT: moist mucous membranes, no thyromegaly, no cervical lymphadenopathy Cardiovascular: RRR, No MRG Respiratory: CTA B Gastrointestinal: abdomen soft, NT, ND, BS+ Musculoskeletal: no deformities, strength intact in all 4 Skin: moist, warm, no rashes Neurological: no tremor with outstretched hands, DTR normal in all 4  Assessment: 1. Osteoporosis  2. Decreasing vitamin D  Plan: 1. Osteoporosis - Most likely postmenopausal/age-related - no falls or fractures since last visit - We we reviewed together the report of her previous DXA scan from 2016, which is stable - At last visit, I [facility] >> did not have this last year - We started Reclast: She had 3 infusions, last in 01/2017.  No side effects.  No hip pain, no femoral pain, no jaw pain.  No dental work in progress or planned. - Plan to continue Reclast for at least 5 years - Today we will check a BMP and a vitamin D - I will see her back in a year  2. Decreasing vitamin D - she sometimes forgets her vitamin D supplement (1000 units) - We will check another level today.  Carlus Pavlovristina Mone Commisso, MD PhD [facility]

## 2017-12-18 LAB — BASIC METABOLIC PANEL WITH GFR
BUN: 22 mg/dL (ref 7–25)
CO2: 29 mmol/L (ref 20–32)
Calcium: 9.4 mg/dL (ref 8.6–10.4)
Chloride: 102 mmol/L (ref 98–110)
Creat: 0.74 mg/dL (ref 0.50–0.99)
GFR, Est African American: 98 mL/min/{1.73_m2} (ref >=60)
GFR, Est Non African American: 84 mL/min/{1.73_m2} (ref >=60)
Glucose, Bld: 86 mg/dL (ref 65–99)
POTASSIUM: 4.4 mmol/L (ref 3.5–5.3)
SODIUM: 139 mmol/L (ref 135–146)

## 2017-12-23 ENCOUNTER — Ambulatory Visit: Payer: Medicare Other | Admitting: Physician Assistant

## 2017-12-23 NOTE — Progress Notes (Deleted)
Amber Peck  MRN: [id number] DOB: Jul 18, 1950  Subjective:  @NAMEBYAGE @ is a 67 y.o. female who presents for annual physical exam and ***.  Last dental exam:  Last vision exam: Last pap smear: Last mammogram: Last colonoscopy: Vaccinations      Tetanus      HPV      Zostavax   Chronic conditions: 1) Anxiety: Restarted on cymbalta 30mg  at last OV in 11/2017. Today, reports ***.  Patient Active Problem List   Diagnosis Date Noted  . Osteoporosis 12/17/2017  . 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 Pseudophakia of both eyes 12/23/2012    Current Outpatient Medications on File Prior to Visit  Medication Sig Dispense Refill  . calcium carbonate (OS-CAL) 600 MG TABS tablet Take 600 mg by mouth daily.    . cholecalciferol (VITAMIN D) 1000 UNITS tablet Take 1,000 Units by mouth daily.    . DULoxetine (CYMBALTA) 30 MG capsule Take 1 capsule (30 mg total) by mouth daily. 90 capsule 0  . EPIPEN 2-PAK 0.3 MG/0.3ML SOAJ injection INJECT 0.3ML (0.3MG  TOTAL) INTO THE MUSCLE ONCE 1 Device 0  . valACYclovir (VALTREX) 1000 MG tablet Take 1 tablet (1,000 mg total) by mouth 2 (two) times daily. (Patient not taking: Reported on 12/17/2017) 20 tablet 0  . [DISCONTINUED] albuterol (PROVENTIL HFA;VENTOLIN HFA) 108 (90 BASE) MCG/ACT inhaler Inhale 1-2 puffs into the lungs every 6 (six) hours as needed for wheezing or shortness of breath. 1 Inhaler 0   No current facility-administered medications on file prior to visit.     Allergies  Allergen Reactions  . Bee Venom     Social History   Socioeconomic History  . Marital status: Single    Spouse name: Not on file  . Number of children: Not on file  . Years of education: Not on file  . Highest education level: Not on file  Occupational History  . Not on file  Social Needs  . Financial resource strain: Not on file  . Food insecurity:    Worry: Not on file    Inability: Not on file  . Transportation needs:      Medical: Not on file    Non-medical: Not on file  Tobacco Use  . Smoking status: Never Smoker  . Smokeless tobacco: Never Used  Substance and Sexual Activity  . Alcohol use: Yes    Comment: 2-3 glasses of wine or 2-3 beers per week  . Drug use: No  . Sexual activity: Not Currently  Lifestyle  . Physical activity:    Days per week: Not on file    Minutes per session: Not on file  . Stress: Not on file  Relationships  . Social connections:    Talks on phone: Not on file    Gets together: Not on file    Attends religious service: Not on file    Active member of club or organization: Not on file    Attends meetings of clubs or organizations: Not on file    Relationship status: Not on file  Other Topics Concern  . Not on file  Social History Narrative  . Not on file    No past surgical history on file.  Family History  Problem Relation Age of Onset  . Cancer Mother        breast  . Cancer Father   . Cancer Brother   . Cancer Maternal Grandmother        breast  .  Heart disease Maternal Grandfather   . Cancer Paternal Grandfather        lung    Review of Systems  Objective:  There were no vitals taken for this visit.  Physical Exam No exam data present  Assessment and Plan :  Discussed healthy lifestyle, diet, exercise, preventative care, vaccinations, and addressed patient's concerns. Plan for follow up in ***. Otherwise, plan for specific conditions below.  There are no diagnoses linked to this encounter.  Benjiman CoreBrittany Dayshaun Whobrey PA-C  [facility] 12/23/2017 8:24 AM

## 2017-12-25 ENCOUNTER — Ambulatory Visit: Payer: Medicare Other | Admitting: Physician Assistant

## 2017-12-29 ENCOUNTER — Encounter: Payer: Self-pay | Admitting: Physician Assistant

## 2017-12-29 ENCOUNTER — Ambulatory Visit: Payer: Medicare Other | Admitting: Physician Assistant

## 2017-12-29 ENCOUNTER — Other Ambulatory Visit: Payer: Self-pay

## 2017-12-29 VITALS — BP 102/68 | HR 72 | Temp 98.0°F | Resp 16 | Ht 66.54 in | Wt 108.0 lb

## 2017-12-29 DIAGNOSIS — Z23 Encounter for immunization: Secondary | ICD-10-CM

## 2017-12-29 DIAGNOSIS — Z13220 Encounter for screening for lipoid disorders: Secondary | ICD-10-CM | POA: Diagnosis not present

## 2017-12-29 DIAGNOSIS — Z1329 Encounter for screening for other suspected endocrine disorder: Secondary | ICD-10-CM

## 2017-12-29 DIAGNOSIS — Z124 Encounter for screening for malignant neoplasm of cervix: Secondary | ICD-10-CM | POA: Diagnosis not present

## 2017-12-29 DIAGNOSIS — Z130 Encounter for screening for diseases of the blood and blood-forming organs and certain disorders involving the immune mechanism: Secondary | ICD-10-CM

## 2017-12-29 DIAGNOSIS — F419 Anxiety disorder, unspecified: Secondary | ICD-10-CM

## 2017-12-29 DIAGNOSIS — Z1389 Encounter for screening for other disorder: Secondary | ICD-10-CM

## 2017-12-29 DIAGNOSIS — Z13228 Encounter for screening for other metabolic disorders: Secondary | ICD-10-CM

## 2017-12-29 DIAGNOSIS — Z0000 Encounter for general adult medical examination without abnormal findings: Secondary | ICD-10-CM

## 2017-12-29 MED ORDER — DULOXETINE HCL 60 MG PO CPEP: 60.0000 mg | ORAL_CAPSULE | Freq: Every day | ORAL | 0 refills | Status: DC

## 2017-12-29 NOTE — Patient Instructions (Addendum)
Increase cymbalta to 71m daily. Follow up in 3 months.   We should have your lab results back within one week.   Thank you for letting me participate in your health and well being.    IF you received an x-ray today, you will receive an [facility] from [facility]. Please contact [facility] at [phone] questions or concerns regarding your invoice.   IF you received labwork today, you will receive an invoice from [facility] Please contact [facility] at [phone] questions or concerns regarding your invoice.   Our billing staff will not be able to assist you with questions regarding bills from these companies.  You will be contacted with the lab results as soon as they are available. The fastest way to get your results is to activate your My Chart account. Instructions are located on the last page of this paperwork. If you have not heard from [facility] results in 2 weeks, please contact this office.     Health Maintenance for Postmenopausal Women Menopause is a normal process in which your reproductive ability comes to an end. This process happens gradually over a span of months to years, usually between the ages of 454and 562 Menopause is complete when you have missed 12 consecutive menstrual periods. It is important to talk with your health care provider about some of the most common conditions that affect postmenopausal women, such as heart disease, cancer, and bone loss (osteoporosis). Adopting a healthy lifestyle and getting preventive care can help to promote your health and wellness. Those actions can also lower your chances of developing some of these common conditions. What should I know about menopause? During menopause, you may experience a number of symptoms, such as:  Moderate-to-severe hot flashes.  Night sweats.  Decrease in sex drive.  Mood swings.  Headaches.  Tiredness.  Irritability.  Memory problems.  Insomnia.  Choosing  to treat or not to treat menopausal changes is an individual decision that you make with your health care provider. What should I know about hormone replacement therapy and supplements? Hormone therapy products are effective for treating symptoms that are associated with menopause, such as hot flashes and night sweats. Hormone replacement carries certain risks, especially as you become older. If you are thinking about using estrogen or estrogen with progestin treatments, discuss the benefits and risks with your health care provider. What should I know about heart disease and stroke? Heart disease, heart attack, and stroke become more likely as you age. This may be due, in part, to the hormonal changes that your body experiences during menopause. These can affect how your body processes dietary fats, triglycerides, and cholesterol. Heart attack and stroke are both medical emergencies. There are many things that you can do to help prevent heart disease and stroke:  Have your blood pressure checked at least every 1-2 years. High blood pressure causes heart disease and increases the risk of stroke.  If you are [id number] old, ask your health care provider if you should take aspirin to prevent a heart attack or a stroke.  Do not use any tobacco products, including cigarettes, chewing tobacco, or electronic cigarettes. If you need help quitting, ask your health care provider.  It is important to eat a healthy diet and maintain a healthy weight. ? Be sure to include plenty of vegetables, fruits, low-fat dairy products, and lean protein. ? Avoid eating foods that are high in solid fats, added sugars, or salt (sodium).  Get regular exercise.  This is one of the most important things that you can do for your health. ? Try to exercise for at least 150 minutes each week. The type of exercise that you do should increase your heart rate and make you sweat. This is known as moderate-intensity exercise. ? Try to  do strengthening exercises at least twice each week. Do these in addition to the moderate-intensity exercise.  Know your numbers.Ask your health care provider to check your cholesterol and your blood glucose. Continue to have your blood tested as directed by your health care provider.  What should I know about cancer screening? There are several types of cancer. Take the following steps to reduce your risk and to catch any cancer development as early as possible. Breast Cancer  Practice breast self-awareness. ? This means understanding how your breasts normally appear and feel. ? It also means doing regular breast self-exams. Let your health care provider know about any changes, no matter how small.  If you are 18 or older, have a clinician do a breast exam (clinical breast exam or CBE) every year. Depending on your age, family history, and medical history, it may be recommended that you also have a yearly breast X-ray (mammogram).  If you have a family history of breast cancer, talk with your health care provider about genetic screening.  If you are at high risk for breast cancer, talk with your health care provider about having an MRI and a mammogram every year.  Breast cancer (BRCA) gene test is recommended for women who have family members with BRCA-related cancers. Results of the assessment will determine the need for genetic counseling and BRCA1 and for BRCA2 testing. BRCA-related cancers include these types: ? Breast. This occurs in males or females. ? Ovarian. ? Tubal. This may also be called fallopian tube cancer. ? Cancer of the abdominal or pelvic lining (peritoneal cancer). ? Prostate. ? Pancreatic.  Cervical, Uterine, and Ovarian Cancer Your health care provider may recommend that you be screened regularly for cancer of the pelvic organs. These include your ovaries, uterus, and vagina. This screening involves a pelvic exam, which includes checking for microscopic changes to  the surface of your cervix (Pap test).  For women ages 21-65, health care providers may recommend a pelvic exam and a Pap test every three years. For women ages 53-65, they may recommend the Pap test and pelvic exam, combined with testing for human papilloma virus (HPV), every five years. Some types of HPV increase your risk of cervical cancer. Testing for HPV may also be done on women of any age who have unclear Pap test results.  Other health care providers may not recommend any screening for nonpregnant women who are considered low risk for pelvic cancer and have no symptoms. Ask your health care provider if a screening pelvic exam is right for you.  If you have had past treatment for cervical cancer or a condition that could lead to cancer, you need Pap tests and screening for cancer for at least 20 years after your treatment. If Pap tests have been discontinued for you, your risk factors (such as having a new sexual partner) need to be reassessed to determine if you should start having screenings again. Some women have medical problems that increase the chance of getting cervical cancer. In these cases, your health care provider may recommend that you have screening and Pap tests more often.  If you have a family history of uterine cancer or ovarian cancer, talk with  your health care provider about genetic screening.  If you have vaginal bleeding after reaching menopause, tell your health care provider.  There are currently no reliable tests available to screen for ovarian cancer.  Lung Cancer Lung cancer screening is recommended for adults 73-66 years old who are at high risk for lung cancer because of a history of smoking. A yearly low-dose CT scan of the lungs is recommended if you:  Currently smoke.  Have a history of at least 30 pack-years of smoking and you currently smoke or have quit within the past 15 years. A pack-year is smoking an average of one pack of cigarettes per day for one  year.  Yearly screening should:  Continue until it has been 15 years since you quit.  Stop if you develop a health problem that would prevent you from having lung cancer treatment.  Colorectal Cancer  This type of cancer can be detected and can often be prevented.  Routine colorectal cancer screening usually begins at age 31 and continues through age 45.  If you have risk factors for colon cancer, your health care provider may recommend that you be screened at an earlier age.  If you have a family history of colorectal cancer, talk with your health care provider about genetic screening.  Your health care provider may also recommend using home test kits to check for hidden blood in your stool.  A small camera at the end of a tube can be used to examine your colon directly (sigmoidoscopy or colonoscopy). This is done to check for the earliest forms of colorectal cancer.  Direct examination of the colon should be repeated every 5-10 years until age 85. However, if early forms of precancerous polyps or small growths are found or if you have a family history or genetic risk for colorectal cancer, you may need to be screened more often.  Skin Cancer  Check your skin from head to toe regularly.  Monitor any moles. Be sure to tell your health care provider: ? About any new moles or changes in moles, especially if there is a change in a mole's shape or color. ? If you have a mole that is larger than the size of a pencil eraser.  If any of your family members has a history of skin cancer, especially at a young age, talk with your health care provider about genetic screening.  Always use sunscreen. Apply sunscreen liberally and repeatedly throughout the day.  Whenever you are outside, protect yourself by wearing long sleeves, pants, a wide-brimmed hat, and sunglasses.  What should I know about osteoporosis? Osteoporosis is a condition in which bone destruction happens more quickly than  new bone creation. After menopause, you may be at an increased risk for osteoporosis. To help prevent osteoporosis or the bone fractures that can happen because of osteoporosis, the following is recommended:  If you are 33-3 years old, get at least 1,000 mg of calcium and at least 600 mg of vitamin D per day.  If you are older than age 62 but younger than age 55, get at least 1,200 mg of calcium and at least 600 mg of vitamin D per day.  If you are older than age 8, get at least 1,200 mg of calcium and at least 800 mg of vitamin D per day.  Smoking and excessive alcohol intake increase the risk of osteoporosis. Eat foods that are rich in calcium and vitamin D, and do weight-bearing exercises several times each week as  directed by your health care provider. What should I know about how menopause affects my mental health? Depression may occur at any age, but it is more common as you become older. Common symptoms of depression include:  Low or sad mood.  Changes in sleep patterns.  Changes in appetite or eating patterns.  Feeling an overall lack of motivation or enjoyment of activities that you previously enjoyed.  Frequent crying spells.  Talk with your health care provider if you think that you are experiencing depression. What should I know about immunizations? It is important that you get and maintain your immunizations. These include:  Tetanus, diphtheria, and pertussis (Tdap) booster vaccine.  Influenza every year before the flu season begins.  Pneumonia vaccine.  Shingles vaccine.  Your health care provider may also recommend other immunizations. This information is not intended to replace advice given to you by your health care provider. Make sure you discuss any questions you have with your health care provider. Document Released: 06/28/2005 Document Revised: 11/24/2015 Document Reviewed: 02/07/2015 Elsevier Interactive Patient Education  2018 [employer].

## 2017-12-29 NOTE — Progress Notes (Signed)
Amber Peck  MRN: [id number] DOB: 1951-04-10  Subjective:  Pt is a  G0P0 postmenopausal 67 y.o. female who presents for annual physical exam.  She is not fasting today.  Diet: Eats mostly white meats like chicken and fish and lots of vegetables and fruits.  Drinks water. Exercise: Goes to the gym at least 4 days/week for 2 hours at a time. Sleep: Trouble sleeping since issues with the neighbor.  Increasing duloxetine to 60 mg did help. BM: Daily  Last dental exam: Few months ago.  Last pap smear: Cannot remember, greater than 5 years ago.  Just turned 67 3 weeks ago. Last two pap smears were normal, cannot remember when the last one was. First sexual partner at age 14. 35 lifetime partners. She is postmenopausal, LMP: cannot remember, years ago.  No PMH STIs, vulvar cancer, or HIV.   Last mammogram: 2019, normal Last colonoscopy: Within the past 10 years, cannot remember. Vaccinations      Tetanus: 11/27/2017      Pneumonia vaccines: Received Pneumovax 23 in 2017      Zostavax: Never   Chronic medical conditions include 1) osteoporosis, managed by Dr. Alvy Bimler. 2) anxiety: She was restarted on duloxetine 30 mg tablet on 11/27/17. Her  anxiety does feel more controlled being on medication.  However she does not feel that it is as controlled as it once was when she was on 60 mg.  She even started taking 60 mg of duloxetine over the past month for a few days and noticed a huge difference in her breakthrough anxiety.  She was also sleeping much better when she was taking 60 mg.Denies N/V/D,constipation, abdominal pain, headache, dry mouth, , dizziness, vomiting, diaphoresis, elevated bp, and heart palpitations. Denies SI or HI.   Patient Active Problem List   Diagnosis Date Noted  . Osteoporosis 12/17/2017  . 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 Pseudophakia of both eyes 12/23/2012    Current Outpatient Medications on File Prior to Visit  Medication Sig  Dispense Refill  . calcium carbonate (OS-CAL) 600 MG TABS tablet Take 600 mg by mouth daily.    . cholecalciferol (VITAMIN D) 1000 UNITS tablet Take 1,000 Units by mouth daily.    Marland Kitchen EPIPEN 2-PAK 0.3 MG/0.3ML SOAJ injection INJECT 0.3ML (0.3MG TOTAL) INTO THE MUSCLE ONCE 1 Device 0  . valACYclovir (VALTREX) 1000 MG tablet Take 1 tablet (1,000 mg total) by mouth 2 (two) times daily. 20 tablet 0  . [DISCONTINUED] albuterol (PROVENTIL HFA;VENTOLIN HFA) 108 (90 BASE) MCG/ACT inhaler Inhale 1-2 puffs into the lungs every 6 (six) hours as needed for wheezing or shortness of breath. 1 Inhaler 0   No current facility-administered medications on file prior to visit.     Allergies  Allergen Reactions  . Bee Venom     Social History   Socioeconomic History  . Marital status: Single    Spouse name: Not on file  . Number of children: Not on file  . Years of education: Not on file  . Highest education level: Not on file  Occupational History  . Not on file  Social Needs  . Financial resource strain: Not on file  . Food insecurity:    Worry: Not on file    Inability: Not on file  . Transportation needs:    Medical: Not on file    Non-medical: Not on file  Tobacco Use  . Smoking status: Never Smoker  . Smokeless tobacco: Never Used  Substance and Sexual Activity  . Alcohol use: Yes    Comment: 2-3 glasses of wine or 2-3 beers per week  . Drug use: No  . Sexual activity: Not Currently  Lifestyle  . Physical activity:    Days per week: 4 days    Minutes per session: 120 min  . Stress: Not on file  Relationships  . Social connections:    Talks on phone: Not on file    Gets together: Not on file    Attends religious service: Not on file    Active member of club or organization: Not on file    Attends meetings of clubs or organizations: Not on file    Relationship status: Not on file  Other Topics Concern  . Not on file  Social History Narrative  . Not on file    History  reviewed. No pertinent surgical history.  Family History  Problem Relation Age of Onset  . Cancer Mother        breast  . Cancer Father   . Cancer Brother   . Cancer Maternal Grandmother        breast  . Heart disease Maternal Grandfather   . Cancer Paternal Grandfather        lung    Review of Systems  Constitutional: Negative for activity change, appetite change, chills, diaphoresis, fatigue, fever and unexpected weight change.  HENT: Negative for congestion, dental problem, drooling, ear discharge, ear pain, facial swelling, hearing loss, mouth sores, nosebleeds, postnasal drip, rhinorrhea, sinus pressure, sinus pain, sneezing, sore throat, tinnitus, trouble swallowing and voice change.   Eyes: Negative for photophobia, pain, discharge, redness, itching and visual disturbance.  Respiratory: Negative for apnea, cough, choking, chest tightness, shortness of breath, wheezing and stridor.   Cardiovascular: Negative for chest pain, palpitations and leg swelling.  Gastrointestinal: Negative for abdominal distention, abdominal pain, anal bleeding, blood in stool, constipation, diarrhea, nausea, rectal pain and vomiting.  Endocrine: Negative for cold intolerance, heat intolerance, polydipsia, polyphagia and polyuria.  Genitourinary: Negative for decreased urine volume, difficulty urinating, dyspareunia, dysuria, enuresis, flank pain, frequency, genital sores, hematuria, menstrual problem, pelvic pain, urgency, vaginal bleeding, vaginal discharge and vaginal pain.  Musculoskeletal: Negative for arthralgias, back pain, gait problem, joint swelling, myalgias, neck pain and neck stiffness.  Skin: Negative for color change, pallor, rash and wound.  Allergic/Immunologic: Negative for environmental allergies, food allergies and immunocompromised state.  Neurological: Negative for dizziness, tremors, seizures, syncope, facial asymmetry, speech difficulty, weakness, light-headedness, numbness and  headaches.  Hematological: Negative for adenopathy. Does not bruise/bleed easily.  Psychiatric/Behavioral: Negative for agitation, behavioral problems, confusion, decreased concentration, dysphoric mood, hallucinations, self-injury, sleep disturbance and suicidal ideas. The patient is nervous/anxious. The patient is not hyperactive.     Objective:  BP 102/68   Pulse 72   Temp 98 F (36.7 C) (Oral)   Resp 16   Ht 5' 6.54" (1.69 m)   Wt 108 lb (49 kg)   SpO2 99%   BMI 17.15 kg/m   Physical Exam  Constitutional: She is oriented to person, place, and time. She appears well-developed and well-nourished. No distress.  HENT:  Head: Normocephalic and atraumatic.  Right Ear: Hearing, tympanic membrane, external ear and ear canal normal.  Left Ear: Hearing, tympanic membrane, external ear and ear canal normal.  Nose: Nose normal.  Mouth/Throat: Uvula is midline, oropharynx is clear and moist and mucous membranes are normal. No oropharyngeal exudate.  Eyes: Pupils are equal, round, and reactive to light. Conjunctivae,  EOM and lids are normal. No scleral icterus.  Neck: Trachea normal and normal range of motion. No thyroid mass and no thyromegaly present.  Cardiovascular: Normal rate, regular rhythm, normal heart sounds and intact distal pulses.  Pulmonary/Chest: Effort normal and breath sounds normal.  Abdominal: Soft. Normal appearance and bowel sounds are normal. There is no tenderness.  Genitourinary: Vagina normal and uterus normal. There is no rash or lesion on the right labia. There is no rash or lesion on the left labia. Uterus is not deviated, not enlarged, not fixed and not tender. Cervix exhibits no motion tenderness, no discharge and no friability. Right adnexum displays no mass, no tenderness and no fullness. Left adnexum displays no mass, no tenderness and no fullness. No tenderness in the vagina. No signs of injury around the vagina. No vaginal discharge found.  Genitourinary  Comments: CMA chaperone present for GU exam.  Musculoskeletal: Normal range of motion.  Lymphadenopathy:       Head (right side): No tonsillar, no preauricular, no posterior auricular and no occipital adenopathy present.       Head (left side): No tonsillar, no preauricular, no posterior auricular and no occipital adenopathy present.    She has no cervical adenopathy.       Right: No supraclavicular adenopathy present.       Left: No supraclavicular adenopathy present.  Neurological: She is alert and oriented to person, place, and time. She has normal strength and normal reflexes.  Skin: Skin is warm and dry.   No exam data present  Assessment and Plan :  Discussed healthy lifestyle, diet, exercise, preventative care, vaccinations, and addressed patient's concerns. Plan for follow up in 3 months. Otherwise, plan for specific conditions below.  1. Annual physical exam Await lab results.   2. Anxiety Patient tolerating duloxetine well.  Has much better response to 60 mg compared to 30 mg increase to duloxetine 34m daily. Follow up in 3 months.  3. Screening for cervical cancer - Pap IG and HPV (high risk) DNA detection  4. Screening for thyroid disorder - TSH  5. Screening cholesterol level - Lipid panel  6. Screening for metabolic disorder - [id number] 7. Screening for hematuria or proteinuria - Urinalysis, dipstick only  8. Screening for deficiency anemia - CBC with Differential/Platelet  Meds ordered this encounter  Medications  . DULoxetine (CYMBALTA) 60 MG capsule    Sig: Take 1 capsule (60 mg total) by mouth daily.    Dispense:  90 capsule    Refill:  0    Order Specific Question:   Supervising Provider    Answer:   SHorald Pollen[[[id number]   BTenna Delaine PA-C  [facility]8/13/2019 10:05 AM

## 2017-12-30 ENCOUNTER — Encounter: Payer: Self-pay | Admitting: Physician Assistant

## 2017-12-30 LAB — CMP14+EGFR
A/G RATIO: 2 (ref 1.2–2.2)
ALBUMIN: 4.5 g/dL (ref 3.6–4.8)
ALK PHOS: 52 IU/L (ref 39–117)
ALT: 17 IU/L (ref 0–32)
AST: 22 IU/L (ref 0–40)
BILIRUBIN TOTAL: 0.2 mg/dL (ref 0.0–1.2)
BUN / CREAT RATIO: 20 (ref 12–28)
BUN: 15 mg/dL (ref 8–27)
CHLORIDE: 98 mmol/L (ref 96–106)
CO2: 25 mmol/L (ref 20–29)
Calcium: 9.9 mg/dL (ref 8.7–10.3)
Creatinine, Ser: 0.74 mg/dL (ref 0.57–1.00)
GFR calc Af Amer: 98 mL/min/{1.73_m2} (ref >59)
GFR calc non Af Amer: 85 mL/min/{1.73_m2} (ref >59)
GLOBULIN, TOTAL: 2.3 g/dL (ref 1.5–4.5)
Glucose: 94 mg/dL (ref 65–99)
POTASSIUM: 4 mmol/L (ref 3.5–5.2)
SODIUM: 141 mmol/L (ref 134–144)
Total Protein: 6.8 g/dL (ref 6.0–8.5)

## 2017-12-30 LAB — TSH: TSH: 2.14 u[IU]/mL (ref 0.450–4.500)

## 2017-12-30 LAB — CBC WITH DIFFERENTIAL/PLATELET
BASOS ABS: 0 10*3/uL (ref 0.0–0.2)
Basos: 0 %
EOS (ABSOLUTE): 0.1 10*3/uL (ref 0.0–0.4)
Eos: 1 %
HEMOGLOBIN: 13.4 g/dL (ref 11.1–15.9)
Hematocrit: 41.7 % (ref 34.0–46.6)
Immature Grans (Abs): 0 10*3/uL (ref 0.0–0.1)
Immature Granulocytes: 0 %
LYMPHS ABS: 2.9 10*3/uL (ref 0.7–3.1)
Lymphs: 36 %
MCH: 29.5 pg (ref 26.6–33.0)
MCHC: 32.1 g/dL (ref 31.5–35.7)
MCV: 92 fL (ref 79–97)
Monocytes Absolute: 0.6 10*3/uL (ref 0.1–0.9)
Monocytes: 7 %
NEUTROS ABS: 4.4 10*3/uL (ref 1.4–7.0)
Neutrophils: 56 %
Platelets: 274 10*3/uL (ref 150–450)
RBC: 4.55 x10E6/uL (ref 3.77–5.28)
RDW: 14 % (ref 12.3–15.4)
WBC: 8.1 10*3/uL (ref 3.4–10.8)

## 2017-12-30 LAB — LIPID PANEL
Chol/HDL Ratio: 2.3 ratio (ref 0.0–4.4)
Cholesterol, Total: 217 mg/dL — ABNORMAL HIGH (ref 100–199)
HDL: 96 mg/dL (ref >39)
LDL Calculated: 106 mg/dL — ABNORMAL HIGH (ref 0–99)
Triglycerides: 73 mg/dL (ref 0–149)
VLDL Cholesterol Cal: 15 mg/dL (ref 5–40)

## 2017-12-30 LAB — URINALYSIS, DIPSTICK ONLY
BILIRUBIN UA: NEGATIVE
Glucose, UA: NEGATIVE
KETONES UA: NEGATIVE
Leukocytes, UA: NEGATIVE
NITRITE UA: NEGATIVE
Protein, UA: NEGATIVE
RBC UA: NEGATIVE
SPEC GRAV UA: 1.014 (ref 1.005–1.030)
UUROB: 0.2 mg/dL (ref 0.2–1.0)
pH, UA: 6.5 (ref 5.0–7.5)

## 2017-12-31 LAB — PAP IG AND HPV HIGH-RISK
HPV, high-risk: NEGATIVE
PAP Smear Comment: 0

## 2018-01-26 ENCOUNTER — Ambulatory Visit: Payer: Medicare Other

## 2018-01-28 ENCOUNTER — Other Ambulatory Visit: Payer: Medicare Other

## 2018-01-29 ENCOUNTER — Other Ambulatory Visit: Payer: Medicare Other

## 2018-02-12 ENCOUNTER — Ambulatory Visit
Admission: RE | Admit: 2018-02-12 | Discharge: 2018-02-12 | Disposition: A | Discharge status: Home / Self Care | Payer: Medicare Other | Source: Ambulatory Visit | Attending: Internal Medicine | Admitting: Internal Medicine

## 2018-02-12 DIAGNOSIS — Z1231 Encounter for screening mammogram for malignant neoplasm of breast: Secondary | ICD-10-CM

## 2018-02-18 ENCOUNTER — Other Ambulatory Visit: Payer: Self-pay | Admitting: Physician Assistant

## 2018-02-18 DIAGNOSIS — F419 Anxiety disorder, unspecified: Secondary | ICD-10-CM

## 2018-02-18 NOTE — Telephone Encounter (Signed)
Requested Prescriptions  Pending Prescriptions Disp Refills  . DULoxetine (CYMBALTA) 30 MG capsule [Pharmacy Med Name: DULOXETINE HCL DR 30 MG CAP] 90 capsule 0    Sig: TAKE 1 CAPSULE BY MOUTH EVERY DAY     Psychiatry: Antidepressants - SNRI Passed - 02/18/2018 12:08 AM      Passed - Last BP in normal range    BP Readings from Last 1 Encounters:  12/29/17 102/68         Passed - Valid encounter within last 6 months    Recent Outpatient Visits          1 month ago Annual physical exam   [facility], [place] D, PA-C   2 months ago Encounter to establish care   [facility] [place], [place] D, PA-C   1 year ago Left arm pain   [facility], Newt Lukes, MD   1 year ago Left forearm pain   [facility], Newt Lukes, MD   2 years ago Depression   [facility], Tessa Lerner, MD

## 2018-02-24 ENCOUNTER — Other Ambulatory Visit: Payer: Medicare Other

## 2018-03-22 ENCOUNTER — Other Ambulatory Visit: Payer: Self-pay | Admitting: Physician Assistant

## 2018-03-23 NOTE — Telephone Encounter (Addendum)
[facility] and spoke to [place]le, [facility] about the refill request. I advised the provider changed the dosage from 30 mg to 60 mg on 12/29/17 and a Rx was sent at that time. Also advised a refill request came through for the 30 mg and it was refilled on 02/18/18, asked did the patient pick this up, she says the patient did not pick up the 30 mg and the last pickup was on 12/29/17 for the 60 mg and she will remove the 30 mg from the profile. I updated the patient's profile to remove the 30 mg.   Patient called to both numbers listed, left VM on both phones to return call to the office to schedule a follow up visit with [place] this month.

## 2018-03-27 ENCOUNTER — Encounter: Payer: Self-pay | Admitting: Family Medicine

## 2018-03-27 ENCOUNTER — Ambulatory Visit: Payer: Medicare Other | Admitting: Family Medicine

## 2018-03-27 VITALS — BP 116/63 | HR 78 | Temp 97.7°F | Resp 16 | Ht 66.0 in | Wt 110.8 lb

## 2018-03-27 DIAGNOSIS — F324 Major depressive disorder, single episode, in partial remission: Secondary | ICD-10-CM

## 2018-03-27 DIAGNOSIS — Z23 Encounter for immunization: Secondary | ICD-10-CM

## 2018-03-27 MED ORDER — DULOXETINE HCL 60 MG PO CPEP: ORAL_CAPSULE | ORAL | 3 refills | Status: AC

## 2018-03-27 NOTE — Progress Notes (Signed)
Chief Complaint  Patient presents with  . Medication Refill    Cymbalta    HPI  Patient reports that she is eating well and was hoping to gain weight Her mood is better on the medication She has tried to come off cymbalta before but it lead to her having depression She states that she has persistent fatigue but she keeps moving She takes melatonin for sleep which helps She takes her cymbalta in the morning She wakes up at 7am She is active in the gym and yoga, and going to the dog park She lives alone with her dog She states that she feels like she stays busy with her daily life needs  Wt Readings from Last 3 Encounters:  03/27/18 110 lb 12.8 oz (50.3 kg)  12/29/17 108 lb (49 kg)  12/17/17 111 lb (50.3 kg)     Depression screen [facility] 2/9 03/27/2018 12/29/2017 11/27/2017 03/20/2016 03/07/2016  Decreased Interest 0 0 3 0 0  Down, Depressed, Hopeless 1 0 1 0 0  PHQ - 2 Score 1 0 4 0 0  Altered sleeping 0 - 3 - -  Tired, decreased energy 2 - 2 - -  Change in appetite 0 - 0 - -  Feeling bad or failure about yourself  1 - 1 - -  Trouble concentrating 0 - 2 - -  Moving slowly or fidgety/restless 0 - 0 - -  Suicidal thoughts 0 - 0 - -  PHQ-9 Score 4 - 12 - -  Difficult doing work/chores Somewhat difficult - - - -     Past Medical History:  Diagnosis Date  . Anxiety   . Cataract     Current Outpatient Medications  Medication Sig Dispense Refill  . calcium carbonate (OS-CAL) 600 MG TABS tablet Take 600 mg by mouth daily.    . cholecalciferol (VITAMIN D) 1000 UNITS tablet Take 1,000 Units by mouth daily.    . DULoxetine (CYMBALTA) 60 MG capsule TAKE 1 CAPSULE BY MOUTH EVERY DAY 90 capsule 3  . EPIPEN 2-PAK 0.3 MG/0.3ML SOAJ injection INJECT 0.3ML (0.3MG  TOTAL) INTO THE MUSCLE ONCE 1 Device 0  . valACYclovir (VALTREX) 1000 MG tablet Take 1 tablet (1,000 mg total) by mouth 2 (two) times daily. 20 tablet 0   No current facility-administered medications for this visit.      Allergies:  Allergies  Allergen Reactions  . Bee Venom     No past surgical history on file.  Social History   Socioeconomic History  . Marital status: Single    Spouse name: Not on file  . Number of children: 0  . Years of education: Not on file  . Highest education level: Not on file  Occupational History  . Not on file  Social Needs  . Financial resource strain: Not on file  . Food insecurity:    Worry: Not on file    Inability: Not on file  . Transportation needs:    Medical: Not on file    Non-medical: Not on file  Tobacco Use  . Smoking status: Never Smoker  . Smokeless tobacco: Never Used  Substance and Sexual Activity  . Alcohol use: Yes    Comment: 2-3 glasses of wine or 2-3 beers per week  . Drug use: No  . Sexual activity: Not Currently  Lifestyle  . Physical activity:    Days per week: 4 days    Minutes per session: 120 min  . Stress: To some extent  Relationships  .  Social connections:    Talks on phone: Not on file    Gets together: Not on file    Attends religious service: Not on file    Active member of club or organization: Not on file    Attends meetings of clubs or organizations: Not on file    Relationship status: Not on file  Other Topics Concern  . Not on file  Social History Narrative  . Not on file    Family History  Problem Relation Age of Onset  . Cancer Mother        breast  . Cancer Father   . Cancer Brother   . Cancer Maternal Grandmother        breast  . Heart disease Maternal Grandfather   . Cancer Paternal Grandfather        lung  . Breast cancer Neg Hx      ROS Review of Systems See HPI Constitution: No fevers or chills No malaise No diaphoresis Skin: No rash or itching Eyes: no blurry vision, no double vision GU: no dysuria or hematuria Neuro: no dizziness or headaches all others reviewed and negative   Objective: Vitals:   03/27/18 1237  BP: 116/63  Pulse: 78  Resp: 16  Temp: 97.7 F (36.5  C)  TempSrc: Oral  SpO2: 98%  Weight: 110 lb 12.8 oz (50.3 kg)  Height: 5\' 6"  (1.676 m)    Physical Exam  Constitutional: She is oriented to person, place, and time. She appears well-developed and well-nourished.  HENT:  Head: Normocephalic and atraumatic.  Eyes: Conjunctivae and EOM are normal.  Neck: Normal range of motion. Neck supple.  Cardiovascular: Normal rate, regular rhythm and normal heart sounds.  No murmur heard. Pulmonary/Chest: Effort normal and breath sounds normal. No stridor. No respiratory distress.  Neurological: She is alert and oriented to person, place, and time.  Skin: Skin is warm. Capillary refill takes less than 2 seconds.  Psychiatric: She has a normal mood and affect. Her behavior is normal. Judgment and thought content normal.      Assessment and Plan Amber Peck was seen today for medication refill.  Diagnoses and all orders for this visit:  Major depressive disorder with single episode, in partial remission (HCC)- continue Cymbalta as instructed Discussed depression and exercise -     DULoxetine (CYMBALTA) 60 MG capsule; TAKE 1 CAPSULE BY MOUTH EVERY DAY  Need for influenza vaccination -     Flu vaccine HIGH DOSE PF     Amber Peck A Amber Peck

## 2018-03-31 ENCOUNTER — Other Ambulatory Visit: Payer: Medicare Other

## 2018-11-19 ENCOUNTER — Telehealth: Payer: Self-pay | Admitting: Family Medicine

## 2018-11-19 NOTE — Telephone Encounter (Signed)
LVM to schedule appt for med refills with Dr. Nolon Rod.

## 2019-01-04 ENCOUNTER — Ambulatory Visit (INDEPENDENT_AMBULATORY_CARE_PROVIDER_SITE_OTHER): Payer: Medicare Other | Admitting: Internal Medicine

## 2019-01-04 DIAGNOSIS — M810 Age-related osteoporosis without current pathological fracture: Secondary | ICD-10-CM

## 2019-01-04 NOTE — Progress Notes (Signed)
NO SHOW  Patient ID: Amber Peck, female   DOB: [birth year], 68 y.o.   MRN: [id number]   HPI  Amber Peck is a 68 y.o.-year-old female,   returning for follow-up for osteoporosis. Last visit 1 year ago.  Reviewed and addended history:  Pt was dx with OP in 2014.  No fractures or falls recently. Had an elbow fx (traumatic - rollerskating) at 68 y/o.   She denies dizziness/vertigo/orthostasis.  Reviewed previous DXA scan reports -of note, she did not have another bone density scan... - ordered in 2018 and 2019: Date L1-L4 T score FN T score  01/03/2015 L1, L3, L4: -1.4 LFN: -2.8  [facility] - 1.2 (-9.5%*) LFN: - 2.9 RFN: n/a  06/30/2002 - 0.3 LFN: - 2.2   We started Reclast in 2016 and she had 4 doses, preparing for the fifth.  No side effects.  No history of vitamin D deficiency, however, last vitamin D level was much lower than before, Lab Results  Component Value Date   VD25OH 31.70 12/17/2017   Previously: 108 in 01/08/2014.  She continues on calcium citrate + magnesium; and vitamin D approximately 1000 units daily.  In the past, she was forgetting the vitamin D frequently.  She was running before, then started going to the gym in the year before last visit, however, not now due to the coronavirus pandemic  No hyper or hypocalcemia, no hyperparathyroidism, no history of kidney stones. Lab Results  Component Value Date   PTH 55.0 06/15/2013   CALCIUM 9.9 12/29/2017   CALCIUM 9.4 12/17/2017   CALCIUM 8.7 12/17/2016   CALCIUM 8.9 02/27/2016   CALCIUM 8.8 12/21/2014   CALCIUM 9.0 11/04/2014   CALCIUM 9.4 09/28/2014   CALCIUM 8.6 01/08/2014   CALCIUM 9.3 06/15/2013   CALCIUM 9.2 11/08/2012   Thyrotoxicosis.  Reviewed her most recent TSH levels: Lab Results  Component Value Date   TSH 2.140 12/29/2017   TSH 1.035 11/04/2014   TSH 3.177 09/28/2014   TSH 1.433 01/08/2014   TSH 1.678 06/15/2013   No CKD.  Latest BUN/Cr: Lab Results  Component Value  Date   BUN 15 12/29/2017   CREATININE 0.74 12/29/2017   Menopause was in her 64s.  She has a family history of osteoporosis in her mother.  ROS: Constitutional: no weight gain/no weight loss, no fatigue, no subjective hyperthermia, no subjective hypothermia Eyes: no blurry vision, no xerophthalmia ENT: no sore throat, no nodules palpated in neck, no dysphagia, no odynophagia, no hoarseness Cardiovascular: no CP/no SOB/no palpitations/no leg swelling Respiratory: no cough/no SOB/no wheezing Gastrointestinal: no N/no V/no D/no C/no acid reflux Musculoskeletal: no muscle aches/no joint aches Skin: no rashes, no hair loss Neurological: no tremors/no numbness/no tingling/no dizziness  I reviewed pt's medications, allergies, PMH, social hx, family hx, and changes were documented in the history of present illness. Otherwise, unchanged from my initial visit note.  Past Medical History:  Diagnosis Date  . Anxiety   . Cataract    No past surgical history on file.   History   Social History  . Marital Status: Single    Spouse Name: N/A  . Number of Children: 0   Occupational History  . Retired Engineer, mining   Social History Main Topics  . Smoking status: Never Smoker   . Smokeless tobacco: Not on file  . Alcohol Use: Yes     Comment: 1 glass of wine 2-3x a mo  . Drug Use: No   Current Outpatient Medications  on File Prior to Visit  Medication Sig Dispense Refill  . calcium carbonate (OS-CAL) 600 MG TABS tablet Take 600 mg by mouth daily.    . cholecalciferol (VITAMIN D) 1000 UNITS tablet Take 1,000 Units by mouth daily.    . DULoxetine (CYMBALTA) 60 MG capsule TAKE 1 CAPSULE BY MOUTH EVERY DAY 90 capsule 3  . EPIPEN 2-PAK 0.3 MG/0.3ML SOAJ injection INJECT 0.3ML (0.3MG  TOTAL) INTO THE MUSCLE ONCE 1 Device 0  . valACYclovir (VALTREX) 1000 MG tablet Take 1 tablet (1,000 mg total) by mouth 2 (two) times daily. 20 tablet 0  . [DISCONTINUED] albuterol (PROVENTIL HFA;VENTOLIN HFA) 108  (90 BASE) MCG/ACT inhaler Inhale 1-2 puffs into the lungs every 6 (six) hours as needed for wheezing or shortness of breath. 1 Inhaler 0   No current facility-administered medications on file prior to visit.    Allergies  Allergen Reactions  . Bee Venom    Family History  Problem Relation Age of Onset  . Cancer Mother        breast  . Cancer Father   . Cancer Brother   . Cancer Maternal Grandmother        breast  . Heart disease Maternal Grandfather   . Cancer Paternal Grandfather        lung  . Breast cancer Neg Hx   Also: - HTN in brother, father, GM - HL in mother, GM - heart ds - GM  PE: There were no vitals taken for this visit. Wt Readings from Last 3 Encounters:  03/27/18 110 lb 12.8 oz (50.3 kg)  12/29/17 108 lb (49 kg)  12/17/17 111 lb (50.3 kg)   Constitutional: thin, in NAD Eyes: PERRLA, EOMI, no exophthalmos ENT: moist mucous membranes, no thyromegaly, no cervical lymphadenopathy Cardiovascular: RRR, No MRG Respiratory: CTA B Gastrointestinal: abdomen soft, NT, ND, BS+ Musculoskeletal: no deformities, strength intact in all 4 Skin: moist, warm, no rashes Neurological: no tremor with outstretched hands, DTR normal in all 4  Assessment: 1. Osteoporosis  2. Decreasing vitamin D  Plan: 1. Osteoporosis -Most likely postmenopausal/age-related -No falls or fractures since last visit -Reviewed together latest DXA scan report [facility] -Started 2 weeks last in 2016 and she now had 5 years of the medication.  No side effects.  No hip pain, femoral pain, jaw pain. -We discussed about getting another DXA scan now and depending on the results starting Reclast for ~3 years.  I would like to check a CTX (marker of bone resorption) now and follow it yearly.  Depending on the level, we may need to restart Reclast sooner.  Also, I will consider starting this sooner if her risk factors increase -Today we will check BMP and vitamin D along with the CTX - I will  see her back in 1 year  2. Decreasing vitamin D - sometimes forgets her vitamin D supplement (1000 units daily) - We will check another level today  Carlus Pavlovristina Ho Parisi, MD PhD [facility]

## 2019-01-14 IMAGING — MG DIGITAL SCREENING BILATERAL MAMMOGRAM WITH TOMO AND CAD
8 series · 9 of 24 positions shown · non-contrast
Comparison: Previous exam(s).

CLINICAL DATA: Screening.

EXAM:
DIGITAL SCREENING BILATERAL MAMMOGRAM WITH TOMO AND CAD

[R MLO synth-2D]
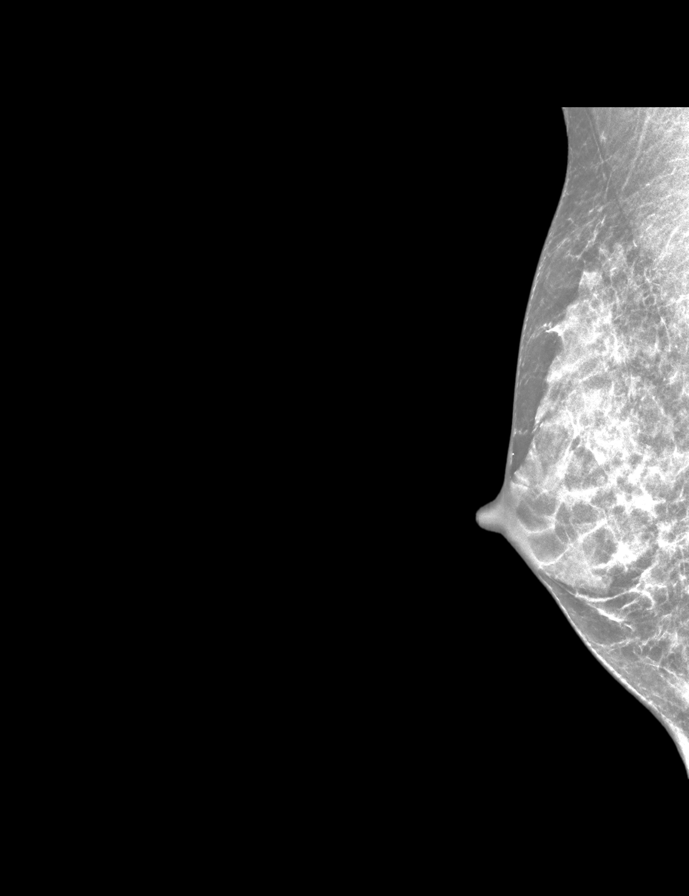

[L CC synth-2D]
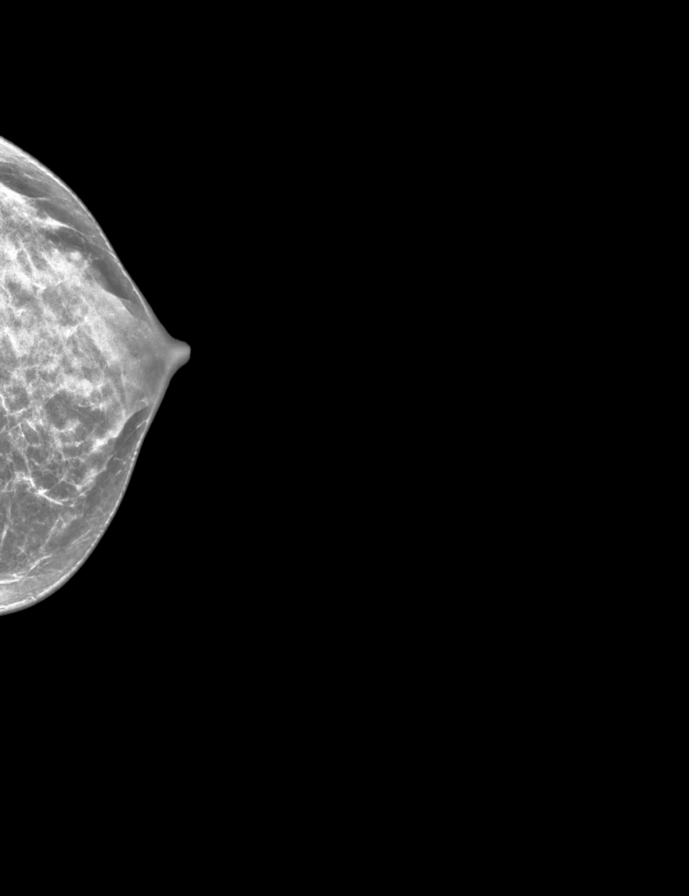

[L MLO synth-2D]
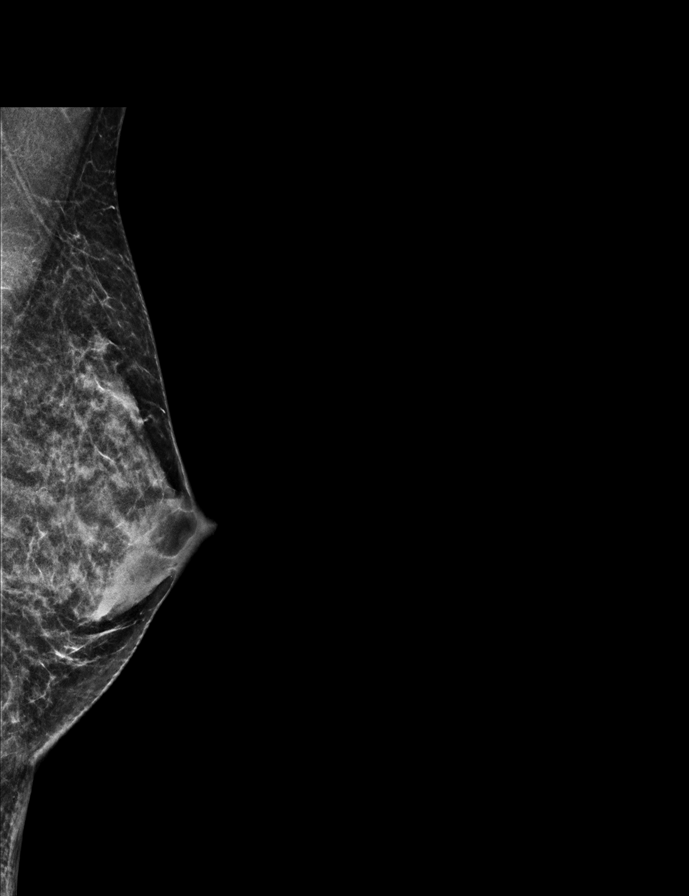

[R CC synth-2D]
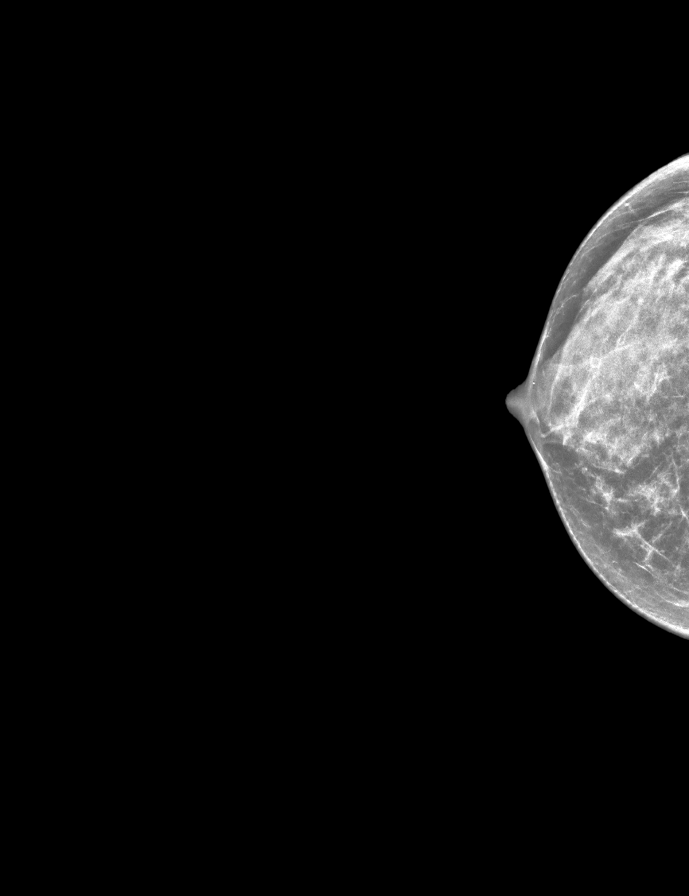

[L CC tomo · 2 of 41 frames shown]
[frame 14/41]
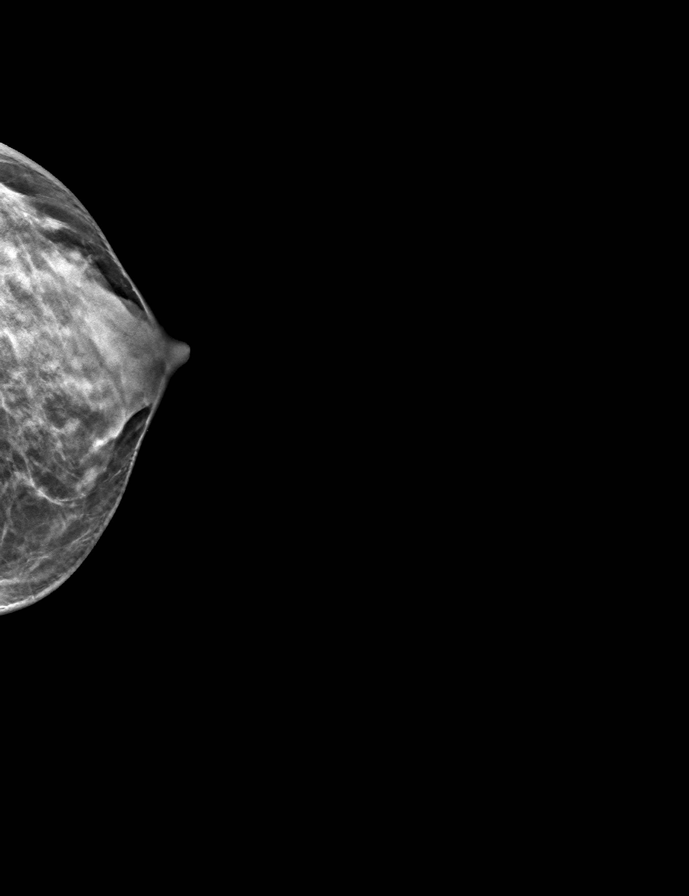
[frame 21/41]
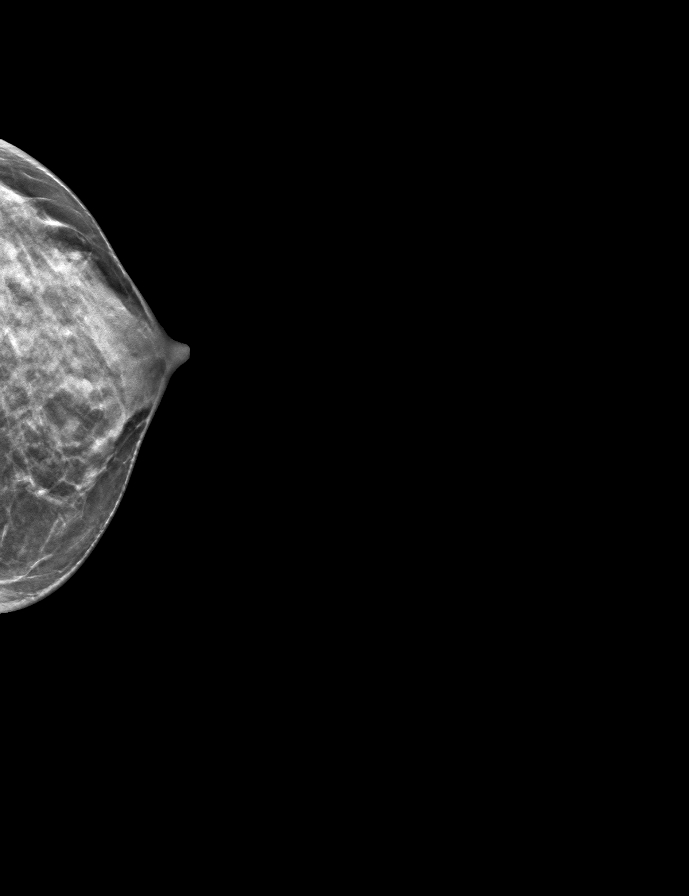

[R MLO tomo · tomo slice 22/43.0]
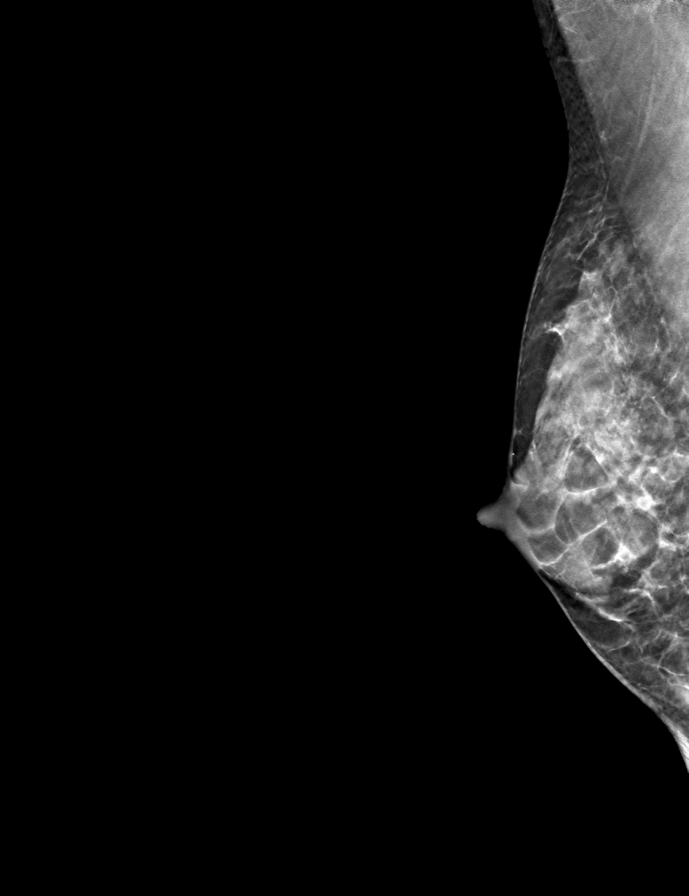

[R CC tomo · tomo slice 23/45.0]
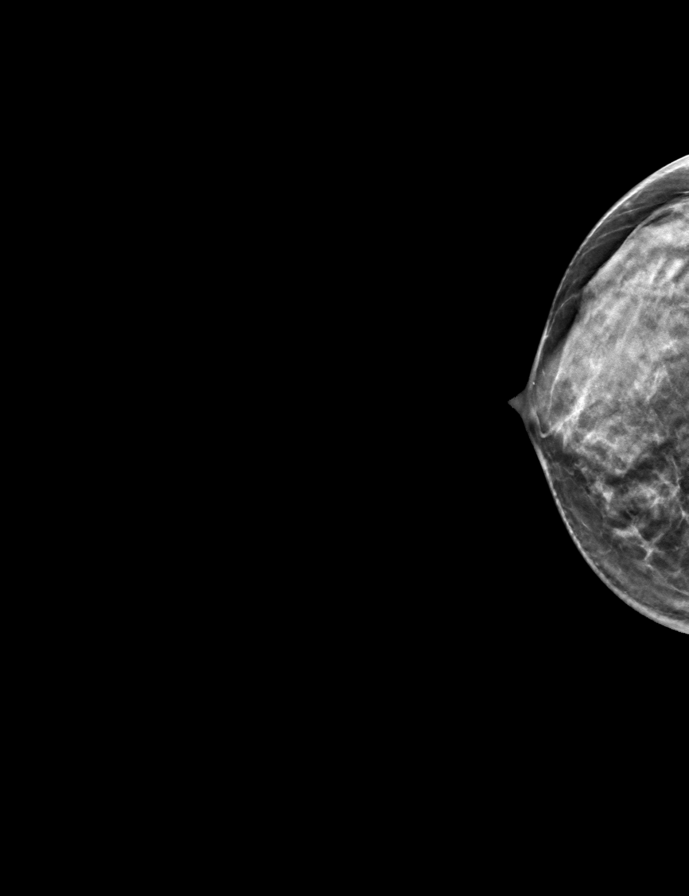

[L MLO tomo · tomo slice 22/43.0]
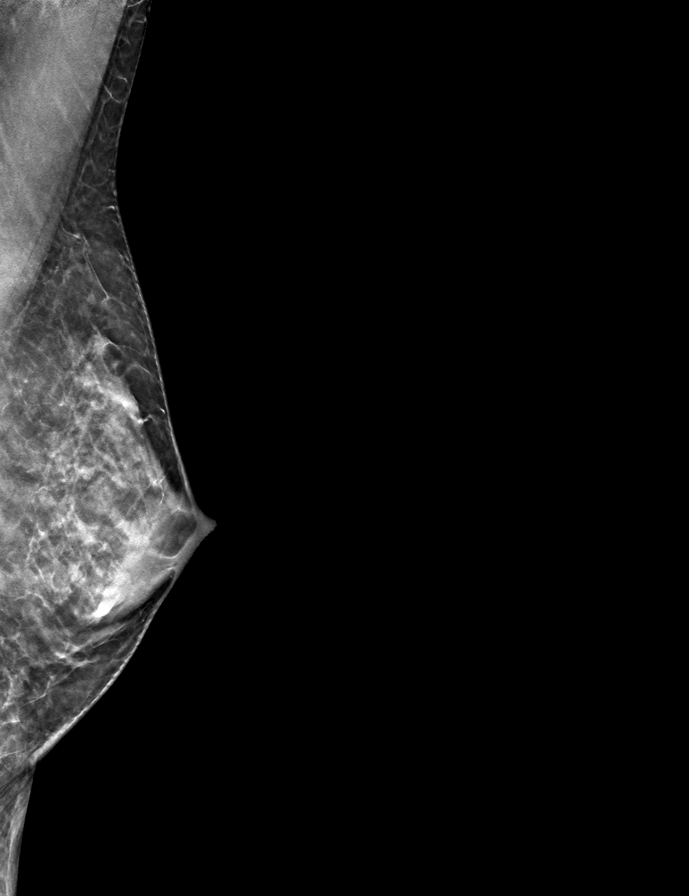

[9 of 24 positions shown; findings below may reference images not displayed]

ACR Breast Density Category c: The breast tissue is heterogeneously
dense, which may obscure small masses.
FINDINGS: There are no findings suspicious for malignancy. Images were
processed with CAD.
IMPRESSION: No mammographic evidence of malignancy. A result letter of this
screening mammogram will be mailed directly to the patient.

RECOMMENDATION:
Screening mammogram in one year. (Code:FT-U-LHB)

BI-RADS CATEGORY  1: Negative.

## 2019-01-27 ENCOUNTER — Encounter: Payer: Self-pay | Admitting: Internal Medicine

## 2019-01-27 ENCOUNTER — Other Ambulatory Visit: Payer: Self-pay | Admitting: Family Medicine

## 2019-01-27 DIAGNOSIS — F324 Major depressive disorder, single episode, in partial remission: Secondary | ICD-10-CM

## 2019-01-27 NOTE — Telephone Encounter (Signed)
Requested medication (s) are due for refill today: yes  Requested medication (s) are on the active medication list: yes  Last refill:  10/09/18  Future visit scheduled: no  Notes to clinic: review for refill   Requested Prescriptions  Pending Prescriptions Disp Refills   DULoxetine (CYMBALTA) 60 MG capsule [Pharmacy Med Name: DULOXETINE HCL DR 60 MG CAP] 90 capsule 3    Sig: TAKE 1 [place]     Psychiatry: Antidepressants - SNRI Failed - 01/27/2019  9:49 AM      Failed - Valid encounter within last 6 months    Recent Outpatient Visits          10 months ago Major depressive disorder with single episode, in partial remission ([place])   [facility], Arlie Solomons, MD   1 year ago Annual physical exam   [facility], [place] D, PA-C   1 year ago Encounter to establish care   [facility], [place] D, PA-C   2 years ago Left arm pain   [facility], Kayleen Memos, MD   2 years ago Left forearm pain   [facility], Kayleen Memos, MD             Failed - Completed PHQ-2 or PHQ-9 in the last 360 days.      Passed - Last BP in normal range    BP Readings from Last 1 Encounters:  03/27/18 116/63

## 2019-03-15 ENCOUNTER — Telehealth: Payer: Self-pay | Admitting: *Deleted

## 2019-03-15 NOTE — Telephone Encounter (Signed)
Schedule Awv 

## 2019-05-19 LAB — FECAL OCCULT BLOOD, IMMUNOCHEMICAL: IFOBT: NEGATIVE

## 2019-06-03 DIAGNOSIS — M85852 Other specified disorders of bone density and structure, left thigh: Secondary | ICD-10-CM | POA: Diagnosis not present

## 2019-06-03 DIAGNOSIS — Z1231 Encounter for screening mammogram for malignant neoplasm of breast: Secondary | ICD-10-CM | POA: Diagnosis not present

## 2019-06-03 DIAGNOSIS — M8589 Other specified disorders of bone density and structure, multiple sites: Secondary | ICD-10-CM | POA: Diagnosis not present

## 2019-06-03 DIAGNOSIS — Z13820 Encounter for screening for osteoporosis: Secondary | ICD-10-CM | POA: Diagnosis not present

## 2019-07-21 DIAGNOSIS — M810 Age-related osteoporosis without current pathological fracture: Secondary | ICD-10-CM | POA: Diagnosis not present

## 2019-07-21 DIAGNOSIS — F331 Major depressive disorder, recurrent, moderate: Secondary | ICD-10-CM | POA: Diagnosis not present

## 2019-07-21 DIAGNOSIS — Z7689 Persons encountering health services in other specified circumstances: Secondary | ICD-10-CM | POA: Diagnosis not present

## 2019-07-21 DIAGNOSIS — F411 Generalized anxiety disorder: Secondary | ICD-10-CM | POA: Diagnosis not present

## 2019-07-27 ENCOUNTER — Other Ambulatory Visit: Payer: Self-pay

## 2019-08-16 DIAGNOSIS — H903 Sensorineural hearing loss, bilateral: Secondary | ICD-10-CM | POA: Diagnosis not present

## 2019-10-21 DIAGNOSIS — M810 Age-related osteoporosis without current pathological fracture: Secondary | ICD-10-CM | POA: Diagnosis not present

## 2019-10-21 DIAGNOSIS — E782 Mixed hyperlipidemia: Secondary | ICD-10-CM | POA: Diagnosis not present

## 2019-10-28 DIAGNOSIS — F331 Major depressive disorder, recurrent, moderate: Secondary | ICD-10-CM | POA: Diagnosis not present

## 2019-10-28 DIAGNOSIS — F411 Generalized anxiety disorder: Secondary | ICD-10-CM | POA: Diagnosis not present

## 2019-10-28 DIAGNOSIS — E782 Mixed hyperlipidemia: Secondary | ICD-10-CM | POA: Diagnosis not present

## 2019-12-06 ENCOUNTER — Telehealth: Payer: Self-pay

## 2019-12-06 NOTE — Telephone Encounter (Signed)
Yes, she will need to have another appointment before deciding for further treatment.Marland KitchenMarland Kitchen

## 2019-12-06 NOTE — Telephone Encounter (Signed)
Call phone number listed as cell phone number and left voicemail requesting a call back.

## 2019-12-06 NOTE — Telephone Encounter (Signed)
Called pt and left voicemail requesting a call back. °

## 2019-12-06 NOTE — Telephone Encounter (Signed)
Patient will be due for Reclast in November. She also missed the 2019 and 2020 dose.  Pt no showed for her last visit.  As a result, her last visit was in 2018. Would you like the pt to be seen before setting up with Reclast when the time comes?

## 2019-12-17 DIAGNOSIS — Z91030 Bee allergy status: Secondary | ICD-10-CM | POA: Diagnosis not present

## 2019-12-17 DIAGNOSIS — F411 Generalized anxiety disorder: Secondary | ICD-10-CM | POA: Diagnosis not present

## 2019-12-17 DIAGNOSIS — M810 Age-related osteoporosis without current pathological fracture: Secondary | ICD-10-CM | POA: Diagnosis not present

## 2019-12-17 DIAGNOSIS — F339 Major depressive disorder, recurrent, unspecified: Secondary | ICD-10-CM | POA: Diagnosis not present

## 2020-03-03 DIAGNOSIS — Z8262 Family history of osteoporosis: Secondary | ICD-10-CM | POA: Diagnosis not present

## 2020-03-03 DIAGNOSIS — M810 Age-related osteoporosis without current pathological fracture: Secondary | ICD-10-CM | POA: Diagnosis not present

## 2020-03-03 DIAGNOSIS — Z79899 Other long term (current) drug therapy: Secondary | ICD-10-CM | POA: Diagnosis not present

## 2020-03-14 DIAGNOSIS — M810 Age-related osteoporosis without current pathological fracture: Secondary | ICD-10-CM | POA: Diagnosis not present

## 2020-04-21 DIAGNOSIS — Z0000 Encounter for general adult medical examination without abnormal findings: Secondary | ICD-10-CM | POA: Diagnosis not present

## 2020-04-28 DIAGNOSIS — F411 Generalized anxiety disorder: Secondary | ICD-10-CM | POA: Diagnosis not present

## 2020-04-28 DIAGNOSIS — R232 Flushing: Secondary | ICD-10-CM | POA: Diagnosis not present

## 2020-04-28 DIAGNOSIS — M810 Age-related osteoporosis without current pathological fracture: Secondary | ICD-10-CM | POA: Diagnosis not present

## 2020-04-28 DIAGNOSIS — Z0000 Encounter for general adult medical examination without abnormal findings: Secondary | ICD-10-CM | POA: Diagnosis not present

## 2020-04-28 DIAGNOSIS — F339 Major depressive disorder, recurrent, unspecified: Secondary | ICD-10-CM | POA: Diagnosis not present

## 2020-04-28 DIAGNOSIS — Z1231 Encounter for screening mammogram for malignant neoplasm of breast: Secondary | ICD-10-CM | POA: Diagnosis not present

## 2020-04-28 DIAGNOSIS — E782 Mixed hyperlipidemia: Secondary | ICD-10-CM | POA: Diagnosis not present

## 2020-06-26 DIAGNOSIS — F411 Generalized anxiety disorder: Secondary | ICD-10-CM | POA: Diagnosis not present

## 2020-06-26 DIAGNOSIS — M810 Age-related osteoporosis without current pathological fracture: Secondary | ICD-10-CM | POA: Diagnosis not present

## 2020-09-12 DIAGNOSIS — F331 Major depressive disorder, recurrent, moderate: Secondary | ICD-10-CM | POA: Diagnosis not present

## 2020-09-12 DIAGNOSIS — F411 Generalized anxiety disorder: Secondary | ICD-10-CM | POA: Diagnosis not present
